# Patient Record
Sex: Female | Born: 1960 | Race: White | Hispanic: No | Marital: Married | Smoking: Never smoker
Health system: Southern US, Community
[De-identification: ages and names within clinical notes are randomized; demographics above are authoritative.]

## PROBLEM LIST (undated history)

## (undated) DIAGNOSIS — I10 Essential (primary) hypertension: Secondary | ICD-10-CM

## (undated) DIAGNOSIS — E119 Type 2 diabetes mellitus without complications: Secondary | ICD-10-CM

## (undated) DIAGNOSIS — M1990 Unspecified osteoarthritis, unspecified site: Secondary | ICD-10-CM

## (undated) DIAGNOSIS — F418 Other specified anxiety disorders: Secondary | ICD-10-CM

## (undated) DIAGNOSIS — M8580 Other specified disorders of bone density and structure, unspecified site: Secondary | ICD-10-CM

## (undated) DIAGNOSIS — N809 Endometriosis, unspecified: Secondary | ICD-10-CM

## (undated) DIAGNOSIS — N200 Calculus of kidney: Secondary | ICD-10-CM

## (undated) DIAGNOSIS — E7800 Pure hypercholesterolemia, unspecified: Secondary | ICD-10-CM

## (undated) DIAGNOSIS — E669 Obesity, unspecified: Secondary | ICD-10-CM

## (undated) HISTORY — PX: LITHOTRIPSY: SUR834

## (undated) HISTORY — DX: Type 2 diabetes mellitus without complications: E11.9

## (undated) HISTORY — PX: APPENDECTOMY: SHX54

## (undated) HISTORY — PX: TONSILLECTOMY: SUR1361

## (undated) HISTORY — PX: CHOLECYSTECTOMY: SHX55

## (undated) HISTORY — DX: Endometriosis, unspecified: N80.9

## (undated) HISTORY — DX: Calculus of kidney: N20.0

## (undated) HISTORY — PX: PELVIC LAPAROSCOPY: SHX162

---
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
## 2003-08-02 ENCOUNTER — Ambulatory Visit (HOSPITAL_COMMUNITY): Admission: RE | Admit: 2003-08-02 | Discharge: 2003-08-02 | Payer: Self-pay | Admitting: Otolaryngology

## 2003-08-02 ENCOUNTER — Ambulatory Visit (HOSPITAL_BASED_OUTPATIENT_CLINIC_OR_DEPARTMENT_OTHER): Admission: RE | Admit: 2003-08-02 | Discharge: 2003-08-02 | Payer: Self-pay | Admitting: Otolaryngology

## 2003-08-02 ENCOUNTER — Encounter (INDEPENDENT_AMBULATORY_CARE_PROVIDER_SITE_OTHER): Payer: Self-pay | Admitting: Specialist

## 2004-05-04 ENCOUNTER — Other Ambulatory Visit: Admission: RE | Admit: 2004-05-04 | Discharge: 2004-05-04 | Payer: Self-pay | Admitting: Addiction Medicine

## 2004-05-22 ENCOUNTER — Ambulatory Visit: Payer: Self-pay | Admitting: Gastroenterology

## 2004-06-11 ENCOUNTER — Ambulatory Visit: Payer: Self-pay | Admitting: Gastroenterology

## 2004-06-25 ENCOUNTER — Ambulatory Visit (HOSPITAL_BASED_OUTPATIENT_CLINIC_OR_DEPARTMENT_OTHER): Admission: RE | Admit: 2004-06-25 | Discharge: 2004-06-25 | Payer: Self-pay | Admitting: Obstetrics and Gynecology

## 2005-07-30 ENCOUNTER — Other Ambulatory Visit: Admission: RE | Admit: 2005-07-30 | Discharge: 2005-07-30 | Payer: Self-pay | Admitting: Obstetrics and Gynecology

## 2006-01-10 ENCOUNTER — Ambulatory Visit (HOSPITAL_COMMUNITY): Admission: RE | Admit: 2006-01-10 | Discharge: 2006-01-10 | Payer: Self-pay | Admitting: Obstetrics and Gynecology

## 2006-01-28 ENCOUNTER — Encounter: Admission: RE | Admit: 2006-01-28 | Discharge: 2006-01-28 | Payer: Self-pay | Admitting: Obstetrics and Gynecology

## 2007-08-07 ENCOUNTER — Other Ambulatory Visit: Admission: RE | Admit: 2007-08-07 | Discharge: 2007-08-07 | Payer: Self-pay | Admitting: Obstetrics and Gynecology

## 2008-04-30 IMAGING — MG MM DIGITAL SCREENING BILAT
6 series · 6 of 6 positions shown · non-contrast
Comparison: none

DG SCREEN MAMMOGRAM BILATERAL
Bilateral CC and MLO view(s) were taken.

DIGITAL SCREENING MAMMOGRAM WITH CAD:
There is a fibroglandular pattern.  A possible mass is noted in the left breast.  Spot compression 
views and possibly sonography are recommended for further evaluation.  In the right breast, no 
masses or malignant type calcifications are identified.

[R CC (1 of 2)]
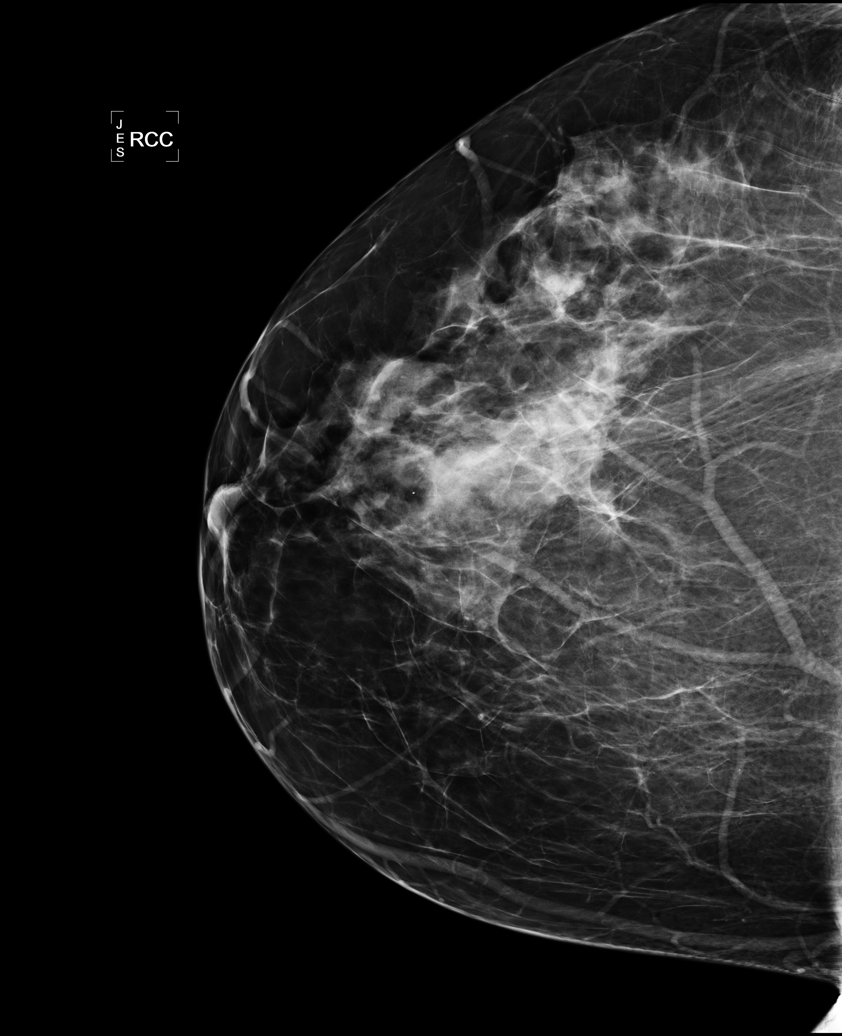

[R MLO]
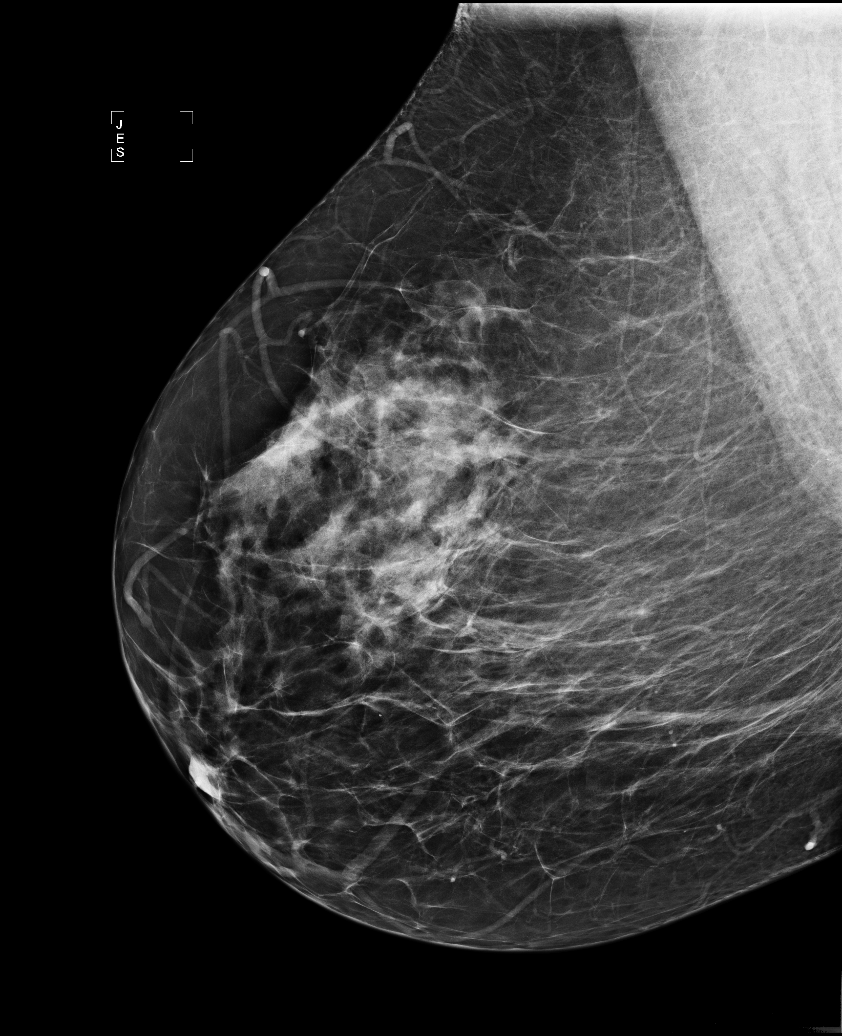

[L CC]
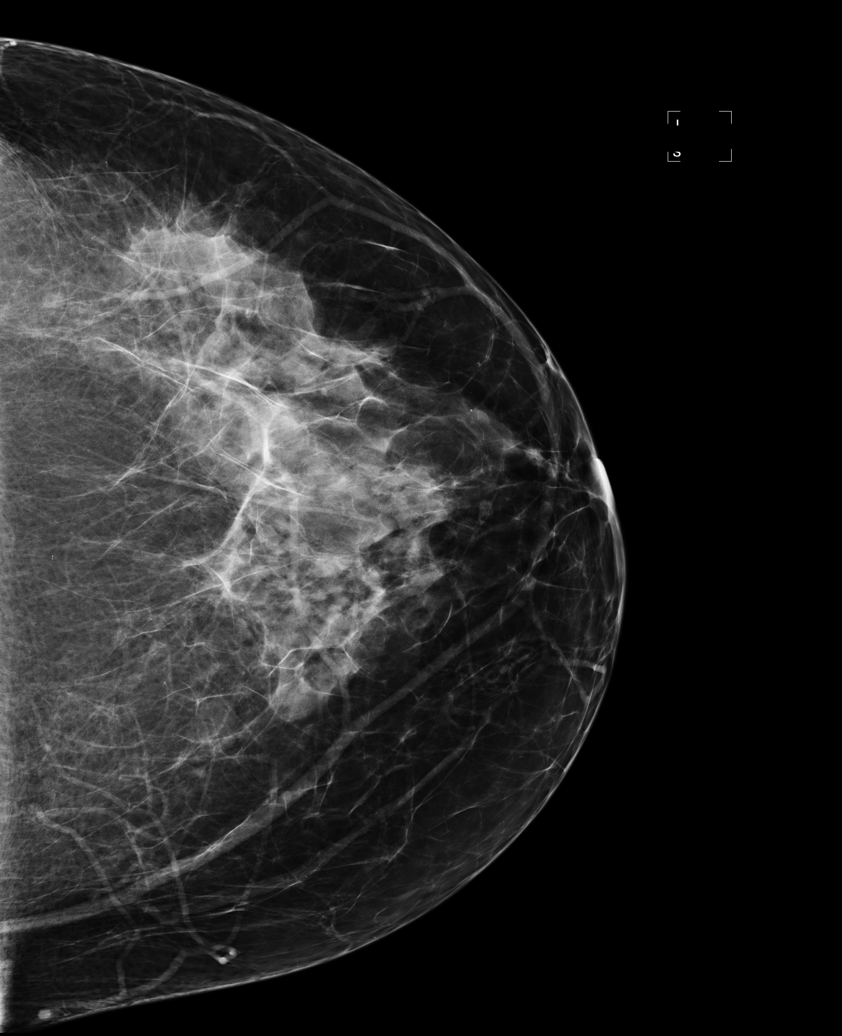

[L MLO (1 of 2)]
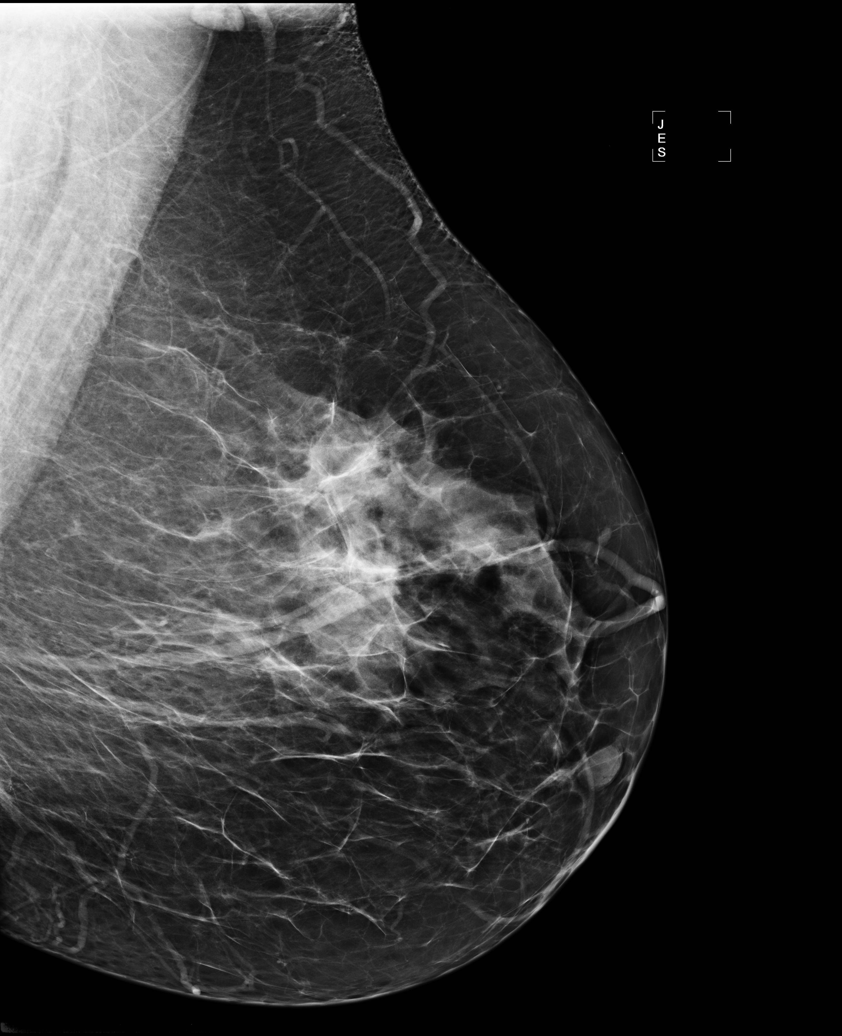

[R CC (2 of 2)]
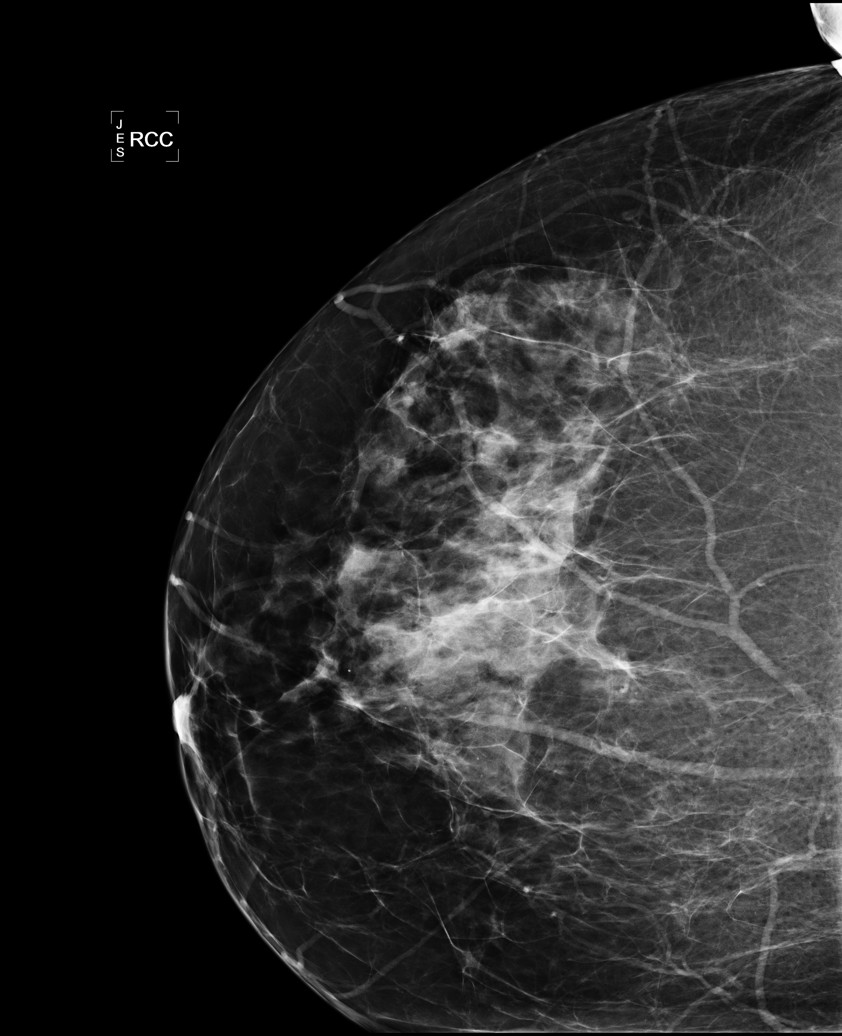

[L MLO (2 of 2)]
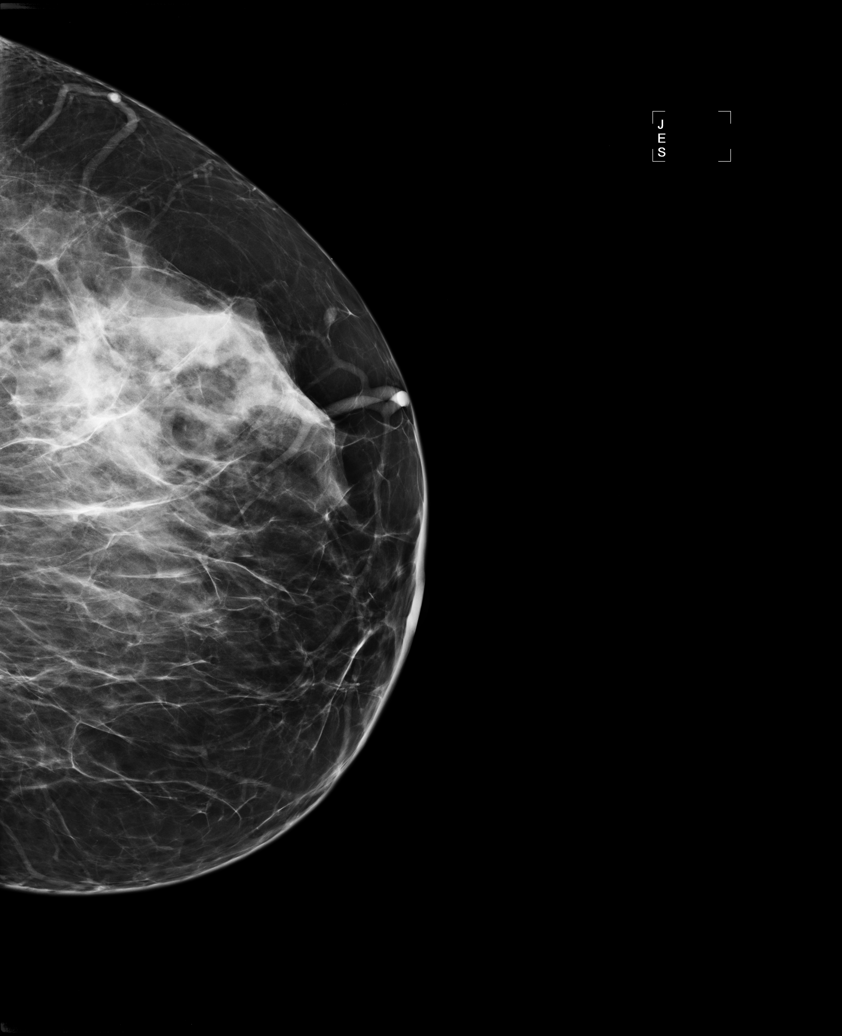

[6 of 6 positions shown; findings below may reference images not displayed]

IMPRESSION: Possible mass, left breast.  Additional evaluation is indicated.  The patient will be contacted for
additional studies and a supplementary report will follow.  No specific mammographic evidence of 
malignancy, right breast.

ASSESSMENT: Need additional imaging evaluation and/or prior mammograms for comparison - BI-RADS 0

Further imaging of the left breast.
ANALYZED BY COMPUTER AIDED DETECTION. , THIS PROCEDURE WAS A DIGITAL MAMMOGRAM.

## 2009-01-28 ENCOUNTER — Encounter: Admission: RE | Admit: 2009-01-28 | Discharge: 2009-01-28 | Payer: Self-pay | Admitting: Obstetrics and Gynecology

## 2009-04-30 ENCOUNTER — Other Ambulatory Visit: Admission: RE | Admit: 2009-04-30 | Discharge: 2009-04-30 | Payer: Self-pay | Admitting: Obstetrics and Gynecology

## 2009-04-30 ENCOUNTER — Ambulatory Visit: Payer: Self-pay | Admitting: Obstetrics and Gynecology

## 2009-05-14 ENCOUNTER — Ambulatory Visit: Payer: Self-pay | Admitting: Obstetrics and Gynecology

## 2009-09-15 ENCOUNTER — Ambulatory Visit (HOSPITAL_COMMUNITY): Admission: RE | Admit: 2009-09-15 | Discharge: 2009-09-15 | Payer: Self-pay | Admitting: Urology

## 2010-01-25 HISTORY — PX: CYSTOSCOPY W/ URETEROSCOPY: SUR379

## 2010-02-03 ENCOUNTER — Encounter
Admission: RE | Admit: 2010-02-03 | Discharge: 2010-02-03 | Payer: Self-pay | Source: Home / Self Care | Attending: Obstetrics and Gynecology | Admitting: Obstetrics and Gynecology

## 2010-02-15 ENCOUNTER — Encounter: Payer: Self-pay | Admitting: Obstetrics and Gynecology

## 2010-05-19 ENCOUNTER — Other Ambulatory Visit (HOSPITAL_COMMUNITY)
Admission: RE | Admit: 2010-05-19 | Discharge: 2010-05-19 | Disposition: A | Discharge status: Home / Self Care | Payer: PRIVATE HEALTH INSURANCE | Source: Ambulatory Visit | Attending: Obstetrics and Gynecology | Admitting: Obstetrics and Gynecology

## 2010-05-19 ENCOUNTER — Encounter (INDEPENDENT_AMBULATORY_CARE_PROVIDER_SITE_OTHER): Payer: PRIVATE HEALTH INSURANCE | Admitting: Obstetrics and Gynecology

## 2010-05-19 ENCOUNTER — Other Ambulatory Visit: Payer: Self-pay | Admitting: Obstetrics and Gynecology

## 2010-05-19 DIAGNOSIS — Z01419 Encounter for gynecological examination (general) (routine) without abnormal findings: Secondary | ICD-10-CM

## 2010-05-19 DIAGNOSIS — Z124 Encounter for screening for malignant neoplasm of cervix: Secondary | ICD-10-CM | POA: Insufficient documentation

## 2010-05-20 ENCOUNTER — Ambulatory Visit (HOSPITAL_BASED_OUTPATIENT_CLINIC_OR_DEPARTMENT_OTHER)
Admission: RE | Admit: 2010-05-20 | Discharge: 2010-05-20 | Disposition: A | Discharge status: Home / Self Care | Payer: PRIVATE HEALTH INSURANCE | Source: Ambulatory Visit | Attending: Urology | Admitting: Urology

## 2010-05-20 DIAGNOSIS — I10 Essential (primary) hypertension: Secondary | ICD-10-CM | POA: Insufficient documentation

## 2010-05-20 DIAGNOSIS — Z01810 Encounter for preprocedural cardiovascular examination: Secondary | ICD-10-CM | POA: Insufficient documentation

## 2010-05-20 DIAGNOSIS — N201 Calculus of ureter: Secondary | ICD-10-CM | POA: Insufficient documentation

## 2010-05-20 DIAGNOSIS — Z01812 Encounter for preprocedural laboratory examination: Secondary | ICD-10-CM | POA: Insufficient documentation

## 2010-05-20 LAB — POCT I-STAT 4, (NA,K, GLUC, HGB,HCT)
Glucose, Bld: 112 mg/dL — ABNORMAL HIGH (ref 70–99)
Potassium: 4.3 mEq/L (ref 3.5–5.1)

## 2010-05-25 NOTE — Op Note (Signed)
  NAMEJEANEAN, HOLLETT                ACCOUNT NO.:  [id number]  MEDICAL RECORD NO.:  [id number]          PATIENT TYPE:  LOCATION:                                 FACILITY:  PHYSICIAN:  Valetta Fuller, M.D.  DATE OF BIRTH:  July 19, 1960  DATE OF PROCEDURE: DATE OF DISCHARGE:                              OPERATIVE REPORT   PREOPERATIVE DIAGNOSIS:  A 5-mm distal left ureteral stone.  POSTOPERATIVE DIAGNOSIS:  A 5-mm distal left ureteral stone.  PROCEDURES PERFORMED: 1. Cystoscopy. 2. Left retrograde pyelogram with fluoroscopic interpretation. 3. Left ureteroscopy with stone basketing.  SURGEON:  Valetta Fuller, M.D.  ANESTHESIA:  General.  INDICATIONS:  Ms. Bottcher has had multiple episodes of nephrolithiasis. The patient recently presented with left flank pain and has had approximately 1 month of ongoing mild discomfort.  Imaging revealed a 5- mm stone just above her left ureterovesical junction.  She has been unable to pass the stone, and last time she was in our office she requested intervention.  Given the size and location of the stone as well as its poor visualization on scout imaging, we fell ureteroscopy would be a better option.  The advantages, disadvantages were discussed with her as well as risks and full informed consent obtained.  TECHNIQUE AND FINDINGS:  The patient was brought to the operating room. She had successful induction of general anesthesia and was placed in mid- lithotomy position and prepped and draped in the usual manner.  She received perioperative ciprofloxacin and appropriate surgical time-out was performed.  Cystoscopy was unremarkable.  Retrograde pyelogram was done with an open-ended catheter and fluoroscopic interpretation.  A 5-mm filling defect noted about a centimeter above her ureterovesical junction with mild obstruction and mild dilation of the proximal collecting system.  Guidewire was placed beyond the stone to the left renal  pelvis.  The distal left ureter was then easily engaged with a 6.5 [place] rigid ureteroscope.  No need for dilation occurred.  The stone was encountered which was basket extracted without difficulty.  There did not appear to be a significant inflammatory response.  Given the lack of need for dilation and a fairly atraumatic stone removal, we felt the patient could be done without double-J stent placement for her benefit.  The guidewire was removed.  Lidocaine jelly was instilled per urethra and the patient was brought to recovery room in stable condition, having had no obvious complications or problems.     Valetta Fuller, M.D.     DSG/MEDQ  D:  05/20/2010  T:  05/20/2010  Job:  [id number]  Electronically Signed by Barron Alvine M.D. on 05/25/2010 12:10:36 PM

## 2010-05-26 ENCOUNTER — Encounter (INDEPENDENT_AMBULATORY_CARE_PROVIDER_SITE_OTHER): Payer: PRIVATE HEALTH INSURANCE

## 2010-05-26 DIAGNOSIS — M949 Disorder of cartilage, unspecified: Secondary | ICD-10-CM

## 2010-06-01 ENCOUNTER — Other Ambulatory Visit (INDEPENDENT_AMBULATORY_CARE_PROVIDER_SITE_OTHER): Payer: PRIVATE HEALTH INSURANCE

## 2010-06-01 DIAGNOSIS — M2550 Pain in unspecified joint: Secondary | ICD-10-CM

## 2010-06-01 DIAGNOSIS — M899 Disorder of bone, unspecified: Secondary | ICD-10-CM

## 2010-06-12 NOTE — Op Note (Signed)
NAMESHENAE, BONANNO                            ACCOUNT NO.:  [id number]   MEDICAL RECORD NO.:  [id number]                   PATIENT TYPE:  AMB   LOCATION:  [facility]                                  FACILITY:  [facility]   PHYSICIAN:  Lucky Cowboy, M.D.                    DATE OF BIRTH:  [birth year]   DATE OF PROCEDURE:  08/02/2003  DATE OF DISCHARGE:                                 OPERATIVE REPORT   CONTINUATION:  Hemostasis was achieved using suction cautery in two  locations on each side.  An NG tube was placed down the esophagus for  suctioning of the gastric contents.  The patient's mouth gag was then  removed, noting no damage to the teeth or the soft tissues.  The table was  rotated clockwise 90 degrees, to its original position.  The patient was awakened from anesthesia and taken to the post-anesthesia  care unit in stable condition.  There were no complications.                                               Lucky Cowboy, M.D.    SJ/MEDQ  D:  08/02/2003  T:  08/03/2003  Job:  [id number]

## 2010-06-12 NOTE — Op Note (Signed)
NAME:  Heidi Hogan, Heidi Hogan                ACCOUNT NO.:  [id number]   MEDICAL RECORD NO.:  [id number]          PATIENT TYPE:  AMB   LOCATION:  [facility]                         FACILITY:  [facility]   PHYSICIAN:  Daniel L. Gottsegen, M.D.DATE OF BIRTH:  July 27, 1960   DATE OF PROCEDURE:  06/25/2004  DATE OF DISCHARGE:                                 OPERATIVE REPORT   PREOPERATIVE DIAGNOSIS:  Left lower quadrant pain.   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   OPERATIONS:  Diagnostic laparoscopy with lysis of pelvic adhesions and laser  vaporization of endometriosis.   SURGEON:  Daniel L. Eda Paschal, M.D.   ANESTHESIA:  General endotracheal.   INDICATIONS:  The patient is a 50 year old female with a long history of  left lower quadrant pain. Ultrasound  in the office was normal except for a  possibly adherent left ovary. The GI and GU workups were obtained prior to  surgery, and neither could reveal a source of her pain. As a result of this,  she now enters the hospital for laparoscopy and appropriate treatment. She  has even given me permission to remove the left ovary and tube if its  adherent and is the source of her pain.   FINDINGS:  At the time of surgery, it appeared that the greatest source of  her pain was an omental adhesion. It was fairly broad-based, and it extended  down on the left side. You could see where it was pulling on her intestine,  and it was adherent into the site of her previous cesarean section. Once  this was freed up, the patient had a variety of areas of endometriosis. Both  ovaries and tubes were free of disease as was the vesicouterine fold, the  peritoneum, but the broad ligament posteriorly had significant endometriosis  present.  There were a least four or five different areas of implants. They  appeared to be more superficial than deep.   PROCEDURE:  After adequate general orotracheal anesthesia, the  patient was  placed in the dorsal supine position, prepped and draped in the usual  sterile manner. The bladder was emptied with a Foley catheter. A Hulka  catheter was inserted into the uterus. A pneumoperitoneum was created by  making a subumbilical incision and placing a Verhey needle into the  peritoneal cavity. A pneumoperitoneum was created without difficulty. The 10-  mm trocar was then placed and through that, the operating laparoscope was  placed. A port was placed suprapubically in the midline. First, the  adhesions were taken down using then a Neodidium YAG laser at 12 watts  power. This was done successfully so that we could now see the left side of  the pelvis which we could not when we started the procedure. Care was taken  throughout to be sure that there was no bowel stuck behind the omentum.  There were several areas that bled, and bipolar coagulation stopped the  bleeding without difficulty. Once adhesions had been taken down, all the  endometriosis described above could be seen. It was treated with an  Neodidium YAG laser at 12 watts power without difficulty. It was difficult  to see her ureters, but the areas of endometriosis were elevated away from  the retroperitoneal structures, and it appeared that we had seen her ureters  and that these were far from this site. At the termination of the procedure,  there was absolutely no bleeding noted. All trocars were removed. The  pneumoperitoneum was evacuated. The subumbilical fascial incision was closed  with 0 Vicryl, and then the skin incisions were closed with Dermabond. The  patient tolerated the procedure well and left the operating room in  satisfactory condition.      DLG/MEDQ  D:  06/25/2004  T:  06/25/2004  Job:  [id number]

## 2010-06-12 NOTE — Op Note (Signed)
[name], Heidi Hogan                            ACCOUNT NO.:  [id number]   MEDICAL RECORD NO.:  [id number]                   PATIENT TYPE:  AMB   LOCATION:  [facility]                                  FACILITY:  [facility]   PHYSICIAN:  Lucky Cowboy, M.D.                    DATE OF BIRTH:  01-20-1961   DATE OF PROCEDURE:  08/02/2003  DATE OF DISCHARGE:                                 OPERATIVE REPORT   INCOMPLETE:   PREOPERATIVE DIAGNOSIS:  Chronic tonsillitis.   POSTOPERATIVE DIAGNOSIS:  Chronic tonsillitis.   PROCEDURE:  Tonsillectomy.   SURGEON:  Lucky Cowboy, M.D.   ANESTHESIA:  General.   ESTIMATED BLOOD LOSS:  Was less than 20 mL.   COMPLICATIONS:  None.   SPECIMENS:  Tonsils.   INDICATIONS FOR PROCEDURE:  The patient is a 50 year old female who has  experienced multiple episodes of tonsillitis with frequent sore throats.  Due to the number of infections, a tonsillectomy is performed.   FINDINGS:  The patient was noted to have 2+ bilateral palatine tonsils.  These were cryptic but without signs of an acute infection.   DESCRIPTION OF PROCEDURE:  The patient was taken to the operating room and  placed on the table in the supine position.  She was then placed under  general endotracheal anesthesia, and the table rotated counter-clockwise 90  degrees.  The neck was gently extended using a shoulder roll.  A Crowe-Davis  mouth gag with a #3 tongue blade was then placed intraorally, opened and  suspended on the Mayo stand.  The right palatine tonsil was grasped with  Allis clamps and directed inferomedially.  The Harmonic scalpel was then  used to excise the tonsil, staying within the peritonsillar space, adjacent  to the tonsillar capsule.  The left palatine tonsil was removed in an  identical fashion.  Suction cautery was required in two locations for  hemostasis.                                               Lucky Cowboy, M.D.    SJ/MEDQ  D:  08/02/2003  T:  08/03/2003  Job:   [id number]

## 2010-06-25 ENCOUNTER — Other Ambulatory Visit: Payer: Self-pay | Admitting: Family Medicine

## 2010-06-25 DIAGNOSIS — M25562 Pain in left knee: Secondary | ICD-10-CM

## 2010-06-26 ENCOUNTER — Ambulatory Visit
Admission: RE | Admit: 2010-06-26 | Discharge: 2010-06-26 | Disposition: A | Discharge status: Home / Self Care | Payer: PRIVATE HEALTH INSURANCE | Source: Ambulatory Visit | Attending: Family Medicine | Admitting: Family Medicine

## 2010-06-26 DIAGNOSIS — M25562 Pain in left knee: Secondary | ICD-10-CM

## 2010-07-28 ENCOUNTER — Observation Stay (HOSPITAL_COMMUNITY)
Admission: EM | Admit: 2010-07-28 | Discharge: 2010-07-30 | DRG: 287 | Disposition: A | Discharge status: Home / Self Care | Payer: PRIVATE HEALTH INSURANCE | Attending: Internal Medicine | Admitting: Internal Medicine

## 2010-07-28 ENCOUNTER — Emergency Department (HOSPITAL_COMMUNITY): Payer: PRIVATE HEALTH INSURANCE

## 2010-07-28 DIAGNOSIS — I2510 Atherosclerotic heart disease of native coronary artery without angina pectoris: Secondary | ICD-10-CM | POA: Insufficient documentation

## 2010-07-28 DIAGNOSIS — E785 Hyperlipidemia, unspecified: Secondary | ICD-10-CM | POA: Insufficient documentation

## 2010-07-28 DIAGNOSIS — F329 Major depressive disorder, single episode, unspecified: Secondary | ICD-10-CM | POA: Insufficient documentation

## 2010-07-28 DIAGNOSIS — E669 Obesity, unspecified: Secondary | ICD-10-CM | POA: Insufficient documentation

## 2010-07-28 DIAGNOSIS — S01119A Laceration without foreign body of unspecified eyelid and periocular area, initial encounter: Secondary | ICD-10-CM | POA: Insufficient documentation

## 2010-07-28 DIAGNOSIS — R61 Generalized hyperhidrosis: Secondary | ICD-10-CM | POA: Insufficient documentation

## 2010-07-28 DIAGNOSIS — I10 Essential (primary) hypertension: Secondary | ICD-10-CM | POA: Insufficient documentation

## 2010-07-28 DIAGNOSIS — F3289 Other specified depressive episodes: Secondary | ICD-10-CM | POA: Insufficient documentation

## 2010-07-28 DIAGNOSIS — R0789 Other chest pain: Principal | ICD-10-CM | POA: Insufficient documentation

## 2010-07-28 LAB — LIPID PANEL
HDL: 41 mg/dL (ref >39)
Total CHOL/HDL Ratio: 4.6 RATIO
Triglycerides: 178 mg/dL — ABNORMAL HIGH (ref <150)

## 2010-07-28 LAB — CBC
HCT: 37.8 % (ref 36.0–46.0)
MCHC: 34.1 g/dL (ref 30.0–36.0)
MCV: 88.3 fL (ref 78.0–100.0)

## 2010-07-28 LAB — HEMOGLOBIN A1C
Hgb A1c MFr Bld: 6.4 % — ABNORMAL HIGH (ref <5.7)
Mean Plasma Glucose: 137 mg/dL — ABNORMAL HIGH (ref <117)

## 2010-07-28 LAB — CARDIAC PANEL(CRET KIN+CKTOT+MB+TROPI)
CK, MB: 1.3 ng/mL (ref 0.3–4.0)
Total CK: 62 U/L (ref 7–177)
Troponin I: <0.3 ng/mL (ref <0.30)

## 2010-07-28 LAB — COMPREHENSIVE METABOLIC PANEL
Albumin: 3.7 g/dL (ref 3.5–5.2)
Creatinine, Ser: 0.67 mg/dL (ref 0.50–1.10)
GFR calc Af Amer: >60 mL/min (ref >60)

## 2010-07-28 LAB — CK TOTAL AND CKMB (NOT AT ARMC)
CK, MB: 1.2 ng/mL (ref 0.3–4.0)
Relative Index: INVALID (ref 0.0–2.5)

## 2010-07-28 LAB — DIFFERENTIAL
Basophils Absolute: 0 10*3/uL (ref 0.0–0.1)
Eosinophils Absolute: 0.2 10*3/uL (ref 0.0–0.7)
Eosinophils Relative: 2 % (ref 0–5)
Lymphocytes Relative: 23 % (ref 12–46)
Monocytes Absolute: 0.6 10*3/uL (ref 0.1–1.0)
Monocytes Relative: 8 % (ref 3–12)

## 2010-07-28 LAB — LIPASE, BLOOD: Lipase: 28 U/L (ref 11–59)

## 2010-07-29 DIAGNOSIS — R079 Chest pain, unspecified: Secondary | ICD-10-CM

## 2010-07-29 DIAGNOSIS — R072 Precordial pain: Secondary | ICD-10-CM

## 2010-07-29 LAB — T4: T4, Total: 8.6 ug/dL (ref 5.0–12.5)

## 2010-07-29 LAB — CARDIAC PANEL(CRET KIN+CKTOT+MB+TROPI): Relative Index: INVALID (ref 0.0–2.5)

## 2010-07-29 LAB — T3: T3, Total: 80.6 ng/dl (ref 80.0–204.0)

## 2010-07-29 NOTE — H&P (Signed)
Heidi Hogan, [name]:  [id number]  MEDICAL RECORD NO.:  [id number]  LOCATION:  [facility]                         FACILITY:  [facility]  PHYSICIAN:  Talmage Nap, MD  DATE OF BIRTH:  [birth year]  DATE OF ADMISSION:  07/28/2010 DATE OF DISCHARGE:                             HISTORY & PHYSICAL   PRIMARY CARE PHYSICIAN:  Unassigned.  History obtainable from the patient and the patient's spouse.  CHIEF COMPLAINT:  Chest pain of about 2 days duration.  HISTORY OF PRESENT ILLNESS:  The patient is a 50 year old very pleasant Caucasian female with history of hypertension, hyperlipidemia, and obesity presenting to the emergency room with chest pain which started at rest. Pain was said to be retrosternal, described as achy about 7/10 in intensity and radiating to the right shoulder.  This was said to be associated with mild shortness of breath and nausea.  She  denied any history of vomiting.  She denied any fever.  She denied any chills. She denied any rigor.  Pain was said to have lasted about 30 minutes and subsequently subsided.  The first episode of pain was about 2 days ago and at bedtime.  It was said to be about 10/10 in intensity and was nonexertional.  There is no known relieving or aggravating factor.  The patient had a second episode, hence she decided to come to the emergency room to be evaluated.  PAST MEDICAL HISTORY: 1. Hypertension. 2. Arthritis. 3. Depression. 4. Hyperlipidemia. 5. Obesity. 6. kidney stones.  PAST SURGICAL HISTORY: 1. Appendectomy. 2. Cesarean section. 3. Tonsillectomy. 4. Cholecystectomy. 5. Kidney stone status post lithotripsy and also status post     urethroscopy with removal of stone.  PREADMISSION MEDICATIONS: 1. Multivitamin therapeutic one p.o. on Wednesday and Saturday. 2. Lipitor (atorvastatin) 10 mg one p.o. at bedtime. 3. Lisinopril 20 mg 1 p.o. at bedtime. 4. Fish oil over-the-counter 2 tablets p.o. at  bedtime. 5. Effexor SR (venlafaxine) 150 mg one p.o. at bedtime. 6. Relafen (nabumetone) 750 mg one p.o. b.i.d.  ALLERGIES:  She has no known allergies.  SOCIAL HISTORY:  Negative for alcohol or tobacco use.  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  FAMILY HISTORY:  Not significant for any coronary artery disease.  REVIEW OF SYSTEMS:  The patient denies any history of headaches.  No blurry vision.  No nausea or vomiting.  No fever.  No chills.  No rigor. No chest pain.  No shortness of breath.  Denies any cough.  No PND or orthopnea.  No palpitations.  No abdominal discomfort.  No diarrhea or hematochezia.  No dysuria or hematuria.  No swelling of the lower extremities.  No intolerance to heat or cold and no neuropsychiatric disorder.  PHYSICAL EXAMINATION:  GENERAL:  Middle-aged lady,obese,well-hydrated, not in any  respiratory distress at present. VITAL SIGNS:  Blood pressure is 143/67, pulse is 75, respiratory rate 18, temperature is 98.7. HEENT:  Pupils are reactive to light and extraocular muscles are intact. NECK:  No jugular venous distention.  No carotid bruit.  No lymphadenopathy. CHEST:  Clear to auscultation. HEART:  Sounds are 1 and 2. ABDOMEN:  Obese, nontender.  Liver, spleen,  and kidney not palpable. Bowel sounds are positive. EXTREMITIES:  No pedal edema. NEUROLOGIC:  Nonfocal. MUSCULOSKELETAL:  Unremarkable. SKIN:  Normal turgor.  LABORATORY DATA:  First set of cardiac marker, troponin-I less than 0.30.  Hematological indices showed WBC of 7.9, hemoglobin 12.9, hematocrit 37.8, MCV 88.3 with a platelet count of 357 with normal differential.  Chemistry shows sodium of 138, potassium of 4.0, chloride of 102 with a bicarb of 26, glucose is 107, BUN is 14, creatinine 0.67. LFTs showed total protein 7.3, albumin is 3.7, AST 32, ALT 42, alkaline phosphatase 52.  EKG showed normal sinus rhythm with a rate of 72 beats per minute.  No acute ST-wave  change noted.  Otherwise normal EKG. Chest x-ray was normal.  IMPRESSION: 1. Chest pain to rule out acute coronary syndrome. 2. Hypertension. 3. Hyperlipidemia. 4. Arthritis. 5. Depression. 6. History of kidney stone.  PLAN:  Admit the patient to telemetry.  The patient will be on aspirin 325 mg p.o. daily, nitro 0.4 mg sublingual p.r.n. for chest pain, morphine 2 mg IV q.4 p.r.n., lisinopril 20 mg p.o. daily, Lipitor 10 mg daily, fish oil over-the-counter 2 tablets p.o. daily.  The patient will be on Effexor SR (venlafaxine) 150 mg p.o. daily.  GI prophylaxis will be done with Protonix 40 mg p.o. daily and DVT prophylaxis with Lovenox 40 mg subcu q.24.  Further workup to be done on this patient will include cardiac enzymes q.6 x3.  Thyroid panel which include TSH, T3, and T4.  Lipid panel, hemoglobin A1c, and D-dimer stat.  Imaging study to be ordered will include 2-D echo.Ultimately, the patient will need Cardiolite stress test either as an inpatient or outpatient.She will be followed and evaluated on a day-to-day basis.     Talmage Nap, MD     CN/MEDQ  D:  07/28/2010  T:  07/28/2010  Job:  [id number]  Electronically Signed by Talmage Nap  on 07/29/2010 06:47:03 PM

## 2010-07-30 LAB — PROTIME-INR: INR: 1.01 (ref 0.00–1.49)

## 2010-07-31 NOTE — Cardiovascular Report (Signed)
  NAMEJOSALYNN, JOHNDROW                ACCOUNT NO.:  [id number]  MEDICAL RECORD NO.:  [id number]  LOCATION:  3701                         FACILITY:  [facility]  PHYSICIAN:  Rollene Rotunda, MD, FACCDATE OF BIRTH:  Oct 05, 1960  DATE OF PROCEDURE: DATE OF DISCHARGE:  07/30/2010                           CARDIAC CATHETERIZATION   PROCEDURE:  Left heart catheterization/coronary arteriography.  INDICATIONS:  The patient with chest pain suggestive of unstable angina.  PROCEDURE NOTE:  Left heart catheterization was performed via the right radial artery, the radial was cannulated via an anterior wall puncture. A #5-French arterial sheath was inserted via the modified Seldinger technique.  Preformed Judkins and a pigtail catheter were utilized.  The patient tolerated the procedure well, left the lab in stable condition.  RESULTS:  Hemodynamics LV 121/8, AO 119/75.  Coronaries of left main was normal.  The LAD had mid long 25% stenosis.  First and second diagonal were small and normal.  Circumflex in the AV groove was normal.  There was a mid obtuse marginal which was large branching and normal. Posterolateral was tiny and normal.  The right coronary artery was a large dominant vessel.  It was normal throughout its course.  PDA was small and normal.  Posterolateral was small and normal.  Left ventriculogram:  The left ventriculogram was obtained in the RAO projection.  The EF was 65% with normal wall motion.  CONCLUSION:  Minimal coronary plaque.  Normal left ventricular function.  PLAN:  The patient will have medical management.  Further evaluation of nonanginal chest discomfort will be per her primary physician.     Rollene Rotunda, MD, [facility]     JH/MEDQ  D:  07/30/2010  T:  07/30/2010  Job:  [id number]  cc:   Burnell Blanks, MD  Electronically Signed by Rollene Rotunda MD [facility] on 07/31/2010 05:38:15 PM

## 2010-08-09 NOTE — Discharge Summary (Signed)
Heidi Hogan, STRACENER                ACCOUNT NO.:  [id number]  MEDICAL RECORD NO.:  [id number]  LOCATION:  3701                         FACILITY:  [facility]  PHYSICIAN:  Jeoffrey Massed, MD    DATE OF BIRTH:  [birth year]  DATE OF ADMISSION:  07/28/2010 DATE OF DISCHARGE:                        DISCHARGE SUMMARY - REFERRING   PRIMARY CARE PRACTITIONER:  Dr. Burnell Blanks at [place].  PRIMARY DISCHARGE DIAGNOSIS:  Atypical chest pain.  SECONDARY DISCHARGE DIAGNOSES: 1. Hypertension. 2. Dyslipidemia. 3. Obesity. 4. History of nephrolithiasis. 5. History of arthritis. 6. History of depression.  DISCHARGE MEDICATIONS: 1. Aspirin 81 mg 1 tablet daily. 2. Protonix 40 mg 1 tablet daily. 3. Effexor XR 150 mg 1 tablet every evening. 4. Fish oil 2 capsules p.o. every evening. 5. Lipitor 10 mg 1 tablet every evening. 6. Lisinopril 20 mg 1 tablet every evening. 7. Multivitamins 1 tablet p.o., Wednesdays and Saturdays. 8. Nabumetone 750 mg 1 tablet p.o. twice daily p.r.n.  CONSULTATION:  Dr. Elease Hashimoto from [facility].  BRIEF HISTORY OF PRESENT ILLNESS:  The patient is a very pleasant 50- year-old female with a history of hypertension, dyslipidemia, obesity, presented with chest pain.  Per her, the chest pain was associated with some nausea, shortness of breath and mild diaphoresis.  She was then admitted to the hospitalist service for further evaluation and treatment.  For further details, please see the history and physical that was dictated by Dr. Beverly Gust on admission.  PROCEDURES PERFORMED:  The patient underwent a left heart cardiac catheterization which showed minimal coronary plaque with normal LV. Cardiology has recommended further medical management.  PERTINENT LABORATORY DATA: 1. LDL cholesterol was at 111. 2. D-dimer was 0.39 which is within normal range. 3. BUN 6.4. 4. TSH was 0.976. 5. Cardiac enzymes were cycled and these were negative.  PERTINENT RADIOLOGICAL  STUDIES:  Chest x-ray done on July 28, 2010, showed no acute cardiopulmonary disease.  BRIEF HOSPITAL COURSE: 1. Chest pain.  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  She was felt to be a candidate for a     cardiac cath instead of stress test.  She subsequently underwent a     cardiac cath today which showed minimal coronary artery disease and     will continue to require ongoing medical management.  She will be     discharged home later today. 2. Hypertension.  This is stable.  She is to continue Lipitor. 3. Dyslipidemia.  She is to continue Lipitor and fish oil.  She is to     follow up with her primary care practitioner for further continued     care. 4. Arthritis.  This is stable. 5. Depression.  This is stable and she is to continue her Effexor.  DISPOSITION:  It is felt that the patient has met maximal benefit from her inpatient hospital stay, her cardiac catheterization is negative for any significant coronary artery disease and is stable to be discharged home later today.  FOLLOWUP INSTRUCTIONS: 1. The patient will follow up with her primary care practitioner, Dr.     Sharman Crate  Hamrick, within 1-2 weeks upon discharge.  She is to call and     make an appointment. 2. The patient has been counseled extensively by me in regard to the     importance of aggressive risk factor modification for coronary     artery disease, all of this has been explained to her personally by     myself and she claims understanding.  It is also recommended that     she be on a diet and exercise regimen to lose further weight.  She     claims understanding as well.  Total time spent for discharge 45 minutes.     Jeoffrey Massed, MD     SG/MEDQ  D:  07/30/2010  T:  07/30/2010  Job:  [id number]  cc:   Burnell Blanks, MD  Electronically Signed by Jeoffrey Massed  on 08/09/2010 10:56:12 AM

## 2010-08-13 NOTE — Consult Note (Signed)
NAMEDEBRAANN, LIVINGSTONE NO.:  [id number]  MEDICAL RECORD NO.:  [id number]  LOCATION:  3701                         FACILITY:  [facility]  PHYSICIAN:  Vesta Mixer, M.D. DATE OF BIRTH:  11-23-1960  DATE OF CONSULTATION:  07/29/2010 DATE OF DISCHARGE:                                CONSULTATION   Heidi Hogan is a 50 year old female with a history of obesity, hypertension, hyperlipidemia.  She was admitted to the hospital yesterday with episodes of chest pain.  The patient has been in fairly good health.  She has recently been started on cholesterol medicine and blood pressure medicine.  She has had a long history of obesity and uncontrolled hyperlipidemia as well as hypertension.  Several days ago, she had an episode of chest pain.  It was described as a chest pressure or tightness.  It was associated with nausea.  It radiated up to her interscapular region and to her right scapula.  It was associated with some mild diaphoresis.  She also had some shortness of breath.  The episode started with rest and lasted about 30-35 minutes.  It resolved spontaneously.  There was no pleuritic component to the chest pain.  The patient does not get any regular exercise.  She has not had any worsening of her pain, getting up and going to the bathroom.  She denies any syncope or presyncope.  She denies any orthopnea although she feels better if she popped herself upon the pillow.  CURRENT MEDICATIONS: 1. Aspirin 325 mg a day. 2. Lipitor 10 mg a day. 3. Lovaza 2 g a day. 4. Effexor 150 mg a day. 5. Protonix 40 mg a day. 6. Lisinopril 20 mg at night. 7. Nitroglycerin as needed.  ALLERGIES:  She has no known drug allergies.  PAST MEDICAL HISTORY: 1. Obesity. 2. Hypertension. 3. Hyperlipidemia.  SOCIAL HISTORY:  The patient does not smoke.  She drinks alcohol very rarely.  FAMILY HISTORY:  Negative for any significant coronary artery disease.  REVIEW OF SYSTEMS:  All  14-point review of systems were reviewed and were negative.  PHYSICAL EXAMINATION:  GENERAL:  She is a middle-aged female, in no acute distress.  She is alert and oriented x3.  Mood and affect are normal. HEENT:  Sclerae are nonicteric.  Her mucous membranes are moist. NECK:  2+ carotids.  She has no bruits.  There is no JVD.  Her neck is supple. LUNGS:  Clear. HEART:  Regular rate.  S1 and S2.  She has no murmurs, gallops, or rubs. BREASTS:  She has fairly large breasts. ABDOMEN:  Obese.  There is no hepatosplenomegaly.  There is no guarding or rebound. EXTREMITIES:  She has no clubbing, cyanosis, or edema.  Her pulses are intact. NEUROLOGIC:  Nonfocal.  Her EKG is currently pending.  I could not locate the EKG from the emergency room.  Her cardiac enzymes are negative.  Her sodium is 138, potassium is 4.0, chloride is 102, CO2 is 26, glucose is 107, creatinine is 0.67.  Her white blood cell count is 7.9, hemoglobin is 12.9, hematocrit is 37.8.  Her echocardiogram was performed this morning and will be read shortly.  IMPRESSION  AND PLAN: 1. Chest pain.  The patient presents with episodes of chest pain.  She     has several risk factors including hypertension and hyperlipidemia     as well as obesity.  She suspect that she has had     hypercholesterolemia.  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     Her breasts are fairly large and I do not think that a stress     Myoview study would be of any benefit.  I think that there could be     significant breast attenuation from the Myoview study.  Given her     symptoms, I think best option would be to proceed with cardiac     catheterization.  I have discussed the risks, benefits, and options     of heart catheterization.  She understands and agrees to proceed.     All of her other medical problems remained stable.     Vesta Mixer, M.D.     PJN/MEDQ  D:  07/29/2010  T:  07/29/2010  Job:  [id number]  cc:   Burnell Blanks, MD  Electronically Signed by Kristeen Miss M.D. on 08/13/2010 02:45:49 PM

## 2011-02-08 ENCOUNTER — Other Ambulatory Visit: Payer: Self-pay | Admitting: Obstetrics and Gynecology

## 2011-02-08 DIAGNOSIS — Z1231 Encounter for screening mammogram for malignant neoplasm of breast: Secondary | ICD-10-CM

## 2011-02-12 ENCOUNTER — Ambulatory Visit: Payer: PRIVATE HEALTH INSURANCE

## 2011-02-16 ENCOUNTER — Ambulatory Visit: Payer: PRIVATE HEALTH INSURANCE

## 2011-03-15 ENCOUNTER — Ambulatory Visit
Admission: RE | Admit: 2011-03-15 | Discharge: 2011-03-15 | Disposition: A | Discharge status: Home / Self Care | Payer: BC Managed Care – PPO | Source: Ambulatory Visit | Attending: Obstetrics and Gynecology | Admitting: Obstetrics and Gynecology

## 2011-03-15 DIAGNOSIS — Z1231 Encounter for screening mammogram for malignant neoplasm of breast: Secondary | ICD-10-CM

## 2011-06-14 ENCOUNTER — Emergency Department (HOSPITAL_COMMUNITY): Payer: BC Managed Care – PPO

## 2011-06-14 ENCOUNTER — Emergency Department (HOSPITAL_COMMUNITY)
Admission: EM | Admit: 2011-06-14 | Discharge: 2011-06-14 | Disposition: A | Discharge status: Home / Self Care | Payer: BC Managed Care – PPO | Attending: Emergency Medicine | Admitting: Emergency Medicine

## 2011-06-14 ENCOUNTER — Encounter (HOSPITAL_COMMUNITY): Payer: Self-pay | Admitting: Emergency Medicine

## 2011-06-14 DIAGNOSIS — R61 Generalized hyperhidrosis: Secondary | ICD-10-CM | POA: Insufficient documentation

## 2011-06-14 DIAGNOSIS — R0602 Shortness of breath: Secondary | ICD-10-CM | POA: Insufficient documentation

## 2011-06-14 DIAGNOSIS — R6889 Other general symptoms and signs: Secondary | ICD-10-CM | POA: Insufficient documentation

## 2011-06-14 DIAGNOSIS — R05 Cough: Secondary | ICD-10-CM | POA: Insufficient documentation

## 2011-06-14 DIAGNOSIS — R209 Unspecified disturbances of skin sensation: Secondary | ICD-10-CM | POA: Insufficient documentation

## 2011-06-14 DIAGNOSIS — I10 Essential (primary) hypertension: Secondary | ICD-10-CM | POA: Insufficient documentation

## 2011-06-14 DIAGNOSIS — J40 Bronchitis, not specified as acute or chronic: Secondary | ICD-10-CM

## 2011-06-14 DIAGNOSIS — R109 Unspecified abdominal pain: Secondary | ICD-10-CM | POA: Insufficient documentation

## 2011-06-14 DIAGNOSIS — R059 Cough, unspecified: Secondary | ICD-10-CM | POA: Insufficient documentation

## 2011-06-14 DIAGNOSIS — F341 Dysthymic disorder: Secondary | ICD-10-CM | POA: Insufficient documentation

## 2011-06-14 DIAGNOSIS — R000 Tachycardia, unspecified: Secondary | ICD-10-CM | POA: Insufficient documentation

## 2011-06-14 DIAGNOSIS — R079 Chest pain, unspecified: Secondary | ICD-10-CM | POA: Insufficient documentation

## 2011-06-14 HISTORY — DX: Other specified anxiety disorders: F41.8

## 2011-06-14 HISTORY — DX: Essential (primary) hypertension: I10

## 2011-06-14 LAB — URINALYSIS, ROUTINE W REFLEX MICROSCOPIC
Bilirubin Urine: NEGATIVE
Glucose, UA: NEGATIVE mg/dL
Hgb urine dipstick: NEGATIVE
Ketones, ur: NEGATIVE mg/dL
Protein, ur: NEGATIVE mg/dL

## 2011-06-14 LAB — CBC
HCT: 36.7 % (ref 36.0–46.0)
Hemoglobin: 12.1 g/dL (ref 12.0–15.0)
MCH: 29.2 pg (ref 26.0–34.0)
MCHC: 33 g/dL (ref 30.0–36.0)
MCV: 88.6 fL (ref 78.0–100.0)

## 2011-06-14 LAB — DIFFERENTIAL
Eosinophils Absolute: 0.1 10*3/uL (ref 0.0–0.7)
Eosinophils Relative: 1 % (ref 0–5)
Lymphs Abs: 1.2 10*3/uL (ref 0.7–4.0)
Monocytes Relative: 7 % (ref 3–12)

## 2011-06-14 LAB — BASIC METABOLIC PANEL
BUN: 14 mg/dL (ref 6–23)
Calcium: 8.5 mg/dL (ref 8.4–10.5)
Creatinine, Ser: 0.63 mg/dL (ref 0.50–1.10)
GFR calc non Af Amer: >90 mL/min (ref >90)
Glucose, Bld: 112 mg/dL — ABNORMAL HIGH (ref 70–99)
Sodium: 138 mEq/L (ref 135–145)

## 2011-06-14 LAB — POCT I-STAT, CHEM 8
BUN: 15 mg/dL (ref 6–23)
Creatinine, Ser: 0.7 mg/dL (ref 0.50–1.10)
Glucose, Bld: 111 mg/dL — ABNORMAL HIGH (ref 70–99)
Hemoglobin: 12.6 g/dL (ref 12.0–15.0)
TCO2: 24 mmol/L (ref 0–100)

## 2011-06-14 LAB — URINE MICROSCOPIC-ADD ON

## 2011-06-14 MED ORDER — AZITHROMYCIN 250 MG PO TABS: 500.0000 mg | ORAL_TABLET | Freq: Every day | ORAL | Status: AC

## 2011-06-14 NOTE — ED Provider Notes (Signed)
History     CSN: [id number]  Arrival date & time 06/14/11  1251   First MD Initiated Contact with Patient 06/14/11 1435      Chief Complaint  Patient presents with  . Chest Pain    (Consider location/radiation/quality/duration/timing/severity/associated sxs/prior treatment) HPI  51 year old female with history of hypertension, history of anxiety presents with chest pain.  Patient states for the past week she has had intermittent bouts of tingling sensation to her left arm that radiates to her left shoulder blade. This morning while sitting and talking to a friend patient notice a sharp sensation to her mid sternum. States pain is intense with associated diaphoresis, and shortness of breath. She proceeds to take four aspirins and called EMS. She has also received 2 SL nitroglycerin from EMS prior to arrival.  Is currently pain free.  Patient states she has not had any chest pain until this morning. She did complain of some mild running nose, sneezing, and coughing since this a.m. She denies fever, chills, productive cough, hemoptysis, nausea, vomiting, diarrhea, or abdominal pain. Patient denies any symptoms concerning for PE risk factors.  Patient mentioned that she has a history of kidney stones with prior lithotripsy procedure. She believes that she is having another kidney stone that is "acting up". She complains of pain to the left groin for the past several days, but denies urinary symptoms or blood per urine. Her last CT scan one year ago, her urologist is Dr. Abelardo Diesel.    Past Medical History  Diagnosis Date  . Hypertension   . Anxious depression     No past surgical history on file.  No family history on file.  History  Substance Use Topics  . Smoking status: Never Smoker   . Smokeless tobacco: Not on file  . Alcohol Use: No    OB History    Grav Para Term Preterm Abortions TAB SAB Ect Mult Living                  Review of Systems  All other systems reviewed and  are negative.    Allergies  Review of patient's allergies indicates no known allergies.  Home Medications   Current Outpatient Rx  Name Route Sig Dispense Refill  . IBUPROFEN 200 MG PO TABS Oral Take 400 mg by mouth every 8 (eight) hours as needed. For shortness of breath.    Marland Kitchen LISINOPRIL 20 MG PO TABS Oral Take 20 mg by mouth daily.    . VENLAFAXINE HCL ER 150 MG PO CP24 Oral Take 150 mg by mouth daily.      BP 128/75  Pulse 108  Temp(Src) 99.4 F (37.4 C) (Oral)  Resp 22  SpO2 98%  Physical Exam  Nursing note and vitals reviewed. Constitutional: She is oriented to person, place, and time. She appears well-developed and well-nourished. No distress.       Awake, alert, nontoxic appearance  HENT:  Head: Atraumatic.  Right Ear: External ear normal.  Left Ear: External ear normal.  Mouth/Throat: Oropharynx is clear and moist. No oropharyngeal exudate.  Eyes: Conjunctivae are normal. Right eye exhibits no discharge. Left eye exhibits no discharge.  Neck: Neck supple.  Cardiovascular: Regular rhythm.  Exam reveals no gallop and no friction rub.   No murmur heard.      Mild tachycardia noted  Pulmonary/Chest: Effort normal. No respiratory distress. She has no wheezes. She has no rales. She exhibits no tenderness.  Abdominal: Soft. There is no tenderness. There is  no rebound. No hernia. Hernia confirmed negative in the ventral area.       No CVA tenderness noted  Musculoskeletal: She exhibits no edema and no tenderness.       ROM appears intact, no obvious focal weakness. No reproducible left groin pain on examination.  Lymphadenopathy:    She has no cervical adenopathy.  Neurological: She is alert and oriented to person, place, and time.       Mental status and motor strength appears intact  Skin: Skin is warm. No rash noted.  Psychiatric: She has a normal mood and affect.    ED Course  Procedures (including critical care time)   Labs Reviewed  CBC  BASIC METABOLIC  PANEL  TROPONIN I   No results found.   No diagnosis found.   Date: 06/14/2011  Rate:98  Rhythm: normal sinus rhythm  QRS Axis: normal  Intervals: normal  ST/T Wave abnormalities: normal  Conduction Disutrbances:none  Narrative Interpretation:   Old EKG Reviewed: unchanged  Results for orders placed during the hospital encounter of 06/14/11  CBC      Component Value Range   WBC 10.3  4.0 - 10.5 (K/uL)   RBC 4.14  3.87 - 5.11 (MIL/uL)   Hemoglobin 12.1  12.0 - 15.0 (g/dL)   HCT 16.1  09.6 - 04.5 (%)   MCV 88.6  78.0 - 100.0 (fL)   MCH 29.2  26.0 - 34.0 (pg)   MCHC 33.0  30.0 - 36.0 (g/dL)   RDW 40.9  81.1 - 91.4 (%)   Platelets 318  150 - 400 (K/uL)  BASIC METABOLIC PANEL      Component Value Range   Sodium 138  135 - 145 (mEq/L)   Potassium 4.0  3.5 - 5.1 (mEq/L)   Chloride 103  96 - 112 (mEq/L)   CO2 24  19 - 32 (mEq/L)   Glucose, Bld 112 (*) 70 - 99 (mg/dL)   BUN 14  6 - 23 (mg/dL)   Creatinine, Ser 7.82  0.50 - 1.10 (mg/dL)   Calcium 8.5  8.4 - 95.6 (mg/dL)   GFR calc non Af Amer >90  >90 (mL/min)   GFR calc Af Amer >90  >90 (mL/min)  TROPONIN I      Component Value Range   Troponin I <0.30  <0.30 (ng/mL)  URINALYSIS, ROUTINE W REFLEX MICROSCOPIC      Component Value Range   Color, Urine YELLOW  YELLOW    APPearance CLOUDY (*) CLEAR    Specific Gravity, Urine 1.017  1.005 - 1.030    pH 6.0  5.0 - 8.0    Glucose, UA NEGATIVE  NEGATIVE (mg/dL)   Hgb urine dipstick NEGATIVE  NEGATIVE    Bilirubin Urine NEGATIVE  NEGATIVE    Ketones, ur NEGATIVE  NEGATIVE (mg/dL)   Protein, ur NEGATIVE  NEGATIVE (mg/dL)   Urobilinogen, UA 0.2  0.0 - 1.0 (mg/dL)   Nitrite NEGATIVE  NEGATIVE    Leukocytes, UA SMALL (*) NEGATIVE   DIFFERENTIAL      Component Value Range   Neutrophils Relative 79 (*) 43 - 77 (%)   Neutro Abs 7.9 (*) 1.7 - 7.7 (K/uL)   Lymphocytes Relative 12  12 - 46 (%)   Lymphs Abs 1.2  0.7 - 4.0 (K/uL)   Monocytes Relative 7  3 - 12 (%)   Monocytes  Absolute 0.7  0.1 - 1.0 (K/uL)   Eosinophils Relative 1  0 - 5 (%)   Eosinophils Absolute  0.1  0.0 - 0.7 (K/uL)   Basophils Relative 0  0 - 1 (%)   Basophils Absolute 0.0  0.0 - 0.1 (K/uL)  POCT I-STAT, CHEM 8      Component Value Range   Sodium 140  135 - 145 (mEq/L)   Potassium 4.0  3.5 - 5.1 (mEq/L)   Chloride 107  96 - 112 (mEq/L)   BUN 15  6 - 23 (mg/dL)   Creatinine, Ser 1.61  0.50 - 1.10 (mg/dL)   Glucose, Bld 096 (*) 70 - 99 (mg/dL)   Calcium, Ion 0.45  4.09 - 1.32 (mmol/L)   TCO2 24  0 - 100 (mmol/L)   Hemoglobin 12.6  12.0 - 15.0 (g/dL)   HCT 81.1  91.4 - 78.2 (%)  URINE MICROSCOPIC-ADD ON      Component Value Range   Squamous Epithelial / LPF MANY (*) RARE    WBC, UA 3-6  <3 (WBC/hpf)   Bacteria, UA MANY (*) RARE    Dg Chest 2 View  06/14/2011  *RADIOLOGY REPORT*  Clinical Data: Chest pain and shortness of breath.  CHEST - 2 VIEW  Comparison: 07/28/2010.  Findings: The cardiac silhouette, mediastinal and hilar contours are within normal limits and stable.  The lungs demonstrate mild bronchitic changes which appears stable.  No infiltrates, edema or effusions.  The bony thorax is intact.  IMPRESSION: Mild chronic bronchitic type changes but no acute pulmonary findings  Original Report Authenticated By: P. Loralie Champagne, M.D.      MDM  Patient with chest pain which started this morning. Nonexertional. There is some component suggestive of URI symptoms. However, patient state pain feels similar to the prior atypical chest pain when she was admitted last year. Patient has a cardiac catheterization done one year ago which shows minimal coronary plaque. Plan to perform a cardiac workup in the ED. Patient also concerned of kidney stones, however she has no CVA tenderness and no reproducible pain. Will check UA.   4:36 PM Patient is chest pain free. Her EKG is unremarkable. She has a negative troponin. She has recent cardiac cath shows minimal cardiovascular disease. Chest x-ray  shows evidence of bronchitis, which is consistent with her presenting symptoms. Her UA is unremarkable. I have low suspicion for kidney stones at this time. Plan to discharge patient with Z-Pak, and followup with her PCP. Strict return precautions discussed. I have discussed with my attending.       Fayrene Helper, PA-C 06/14/11 1638

## 2011-06-14 NOTE — Discharge Instructions (Signed)

## 2011-06-14 NOTE — ED Notes (Signed)
Per EMS, pt from home, pt reports mid-sternal cp that started ~ an hour ago.  Pt reports pain radiating to her L arm, also reports tingling in her L arm, but reports that tingling came on first before the cp.  Pt also has cold sxs at present.  Pt denies cp at this time.

## 2011-06-16 NOTE — ED Provider Notes (Signed)
Medical screening examination/treatment/procedure(s) were performed by non-physician practitioner and as supervising physician I was immediately available for consultation/collaboration.   Benny Lennert, MD 06/16/11 [phone]

## 2011-06-18 ENCOUNTER — Emergency Department (HOSPITAL_COMMUNITY): Payer: BC Managed Care – PPO

## 2011-06-18 ENCOUNTER — Encounter (HOSPITAL_COMMUNITY): Payer: Self-pay | Admitting: *Deleted

## 2011-06-18 ENCOUNTER — Emergency Department (HOSPITAL_COMMUNITY)
Admission: EM | Admit: 2011-06-18 | Discharge: 2011-06-18 | Disposition: A | Discharge status: Home / Self Care | Payer: BC Managed Care – PPO | Attending: Emergency Medicine | Admitting: Emergency Medicine

## 2011-06-18 DIAGNOSIS — E7800 Pure hypercholesterolemia, unspecified: Secondary | ICD-10-CM | POA: Insufficient documentation

## 2011-06-18 DIAGNOSIS — J40 Bronchitis, not specified as acute or chronic: Secondary | ICD-10-CM

## 2011-06-18 DIAGNOSIS — I10 Essential (primary) hypertension: Secondary | ICD-10-CM | POA: Insufficient documentation

## 2011-06-18 DIAGNOSIS — R05 Cough: Secondary | ICD-10-CM | POA: Insufficient documentation

## 2011-06-18 DIAGNOSIS — R059 Cough, unspecified: Secondary | ICD-10-CM | POA: Insufficient documentation

## 2011-06-18 DIAGNOSIS — R072 Precordial pain: Secondary | ICD-10-CM | POA: Insufficient documentation

## 2011-06-18 HISTORY — DX: Obesity, unspecified: E66.9

## 2011-06-18 HISTORY — DX: Pure hypercholesterolemia, unspecified: E78.00

## 2011-06-18 LAB — CBC
MCV: 88.9 fL (ref 78.0–100.0)
Platelets: 406 10*3/uL — ABNORMAL HIGH (ref 150–400)
RBC: 4.32 MIL/uL (ref 3.87–5.11)
RDW: 13.1 % (ref 11.5–15.5)
WBC: 12.1 10*3/uL — ABNORMAL HIGH (ref 4.0–10.5)

## 2011-06-18 LAB — D-DIMER, QUANTITATIVE: D-Dimer, Quant: 0.37 ug/mL-FEU (ref 0.00–0.48)

## 2011-06-18 LAB — BASIC METABOLIC PANEL
CO2: 25 mEq/L (ref 19–32)
Chloride: 100 mEq/L (ref 96–112)
Creatinine, Ser: 0.8 mg/dL (ref 0.50–1.10)
GFR calc Af Amer: >90 mL/min (ref >90)
Potassium: 3.9 mEq/L (ref 3.5–5.1)
Sodium: 139 mEq/L (ref 135–145)

## 2011-06-18 LAB — CARDIAC PANEL(CRET KIN+CKTOT+MB+TROPI)
CK, MB: 1.6 ng/mL (ref 0.3–4.0)
Relative Index: INVALID (ref 0.0–2.5)

## 2011-06-18 MED ORDER — ALBUTEROL SULFATE (5 MG/ML) 0.5% IN NEBU
5.0000 mg | INHALATION_SOLUTION | Freq: Once | RESPIRATORY_TRACT | Status: AC
  Administered 2011-06-18: 5 mg via RESPIRATORY_TRACT
  Filled 2011-06-18: qty 1

## 2011-06-18 NOTE — ED Notes (Addendum)
Patient transported to X-ray 

## 2011-06-18 NOTE — ED Provider Notes (Signed)
History     CSN: [id number]  Arrival date & time 06/18/11  1435   First MD Initiated Contact with Patient 06/18/11 1502      Chief Complaint  Patient presents with  . Bronchitis  . Chest Pain    (Consider location/radiation/quality/duration/timing/severity/associated sxs/prior treatment) HPI Pt presents with c/o cough and intermittent chest pain.  Pt was seen in ED several days ago, she was diagnosed with bronchitis.  She has since had frequent cough- minimal sputum.  Chest pain is sharp in located in midsternum.  No sob.  No nausea or diaphoresis.  No fever/chills.  Was started on zpack which she has finished.  Also saw her PMD today who gave albuterol inhaler, steroid IM, prednisone rx, cough syrup.  Of note, pt had negative cardiac eval in ED this week, reassuring cardiac cath in the past 3 years.   Pain is not associated with exertion, started while riding in car.  There are no other associated ssytemic symptoms, there are no alleviating or modifying factors.   Past Medical History  Diagnosis Date  . Hypertension   . Anxious depression   . Obesity   . Hypercholesteremia     Past Surgical History  Procedure Date  . Cholecystectomy   . Appendectomy   . Tonsillectomy     No family history on file.  History  Substance Use Topics  . Smoking status: Never Smoker   . Smokeless tobacco: Not on file  . Alcohol Use: No    OB History    Grav Para Term Preterm Abortions TAB SAB Ect Mult Living                  Review of Systems ROS reviewed and all otherwise negative except for mentioned in HPI  Allergies  Review of patient's allergies indicates no known allergies.  Home Medications   Current Outpatient Rx  Name Route Sig Dispense Refill  . ATORVASTATIN CALCIUM 40 MG PO TABS Oral Take 40 mg by mouth at bedtime.    . AZITHROMYCIN 250 MG PO TABS Oral Take 2 tablets (500 mg total) by mouth daily. 4 tablet 0  . IBUPROFEN 200 MG PO TABS Oral Take 400 mg by mouth every  8 (eight) hours as needed. For shortness of breath.    Marland Kitchen LISINOPRIL 20 MG PO TABS Oral Take 20 mg by mouth at bedtime.     . ADULT MULTIVITAMIN W/MINERALS CH Oral Take 1 tablet by mouth 2 (two) times a week.      BP 110/75  Pulse 86  Temp(Src) 98.3 F (36.8 C) (Oral)  Resp 20  SpO2 96% Vitals reviewed Physical Exam Physical Examination: General appearance - alert, well appearing, and in no distress Mental status - alert, oriented to person, place, and time Eyes - pupils equal and reactive, extraocular eye movements intact Mouth - mucous membranes moist, pharynx normal without lesions Chest - clear to auscultation, no wheezes, rales or rhonchi, symmetric air entry, in increased respiratory effort, frequent coughing Heart - normal rate, regular rhythm, normal S1, S2, no murmurs, rubs, clicks or gallops Abdomen - soft, nontender, nondistended, no masses or organomegaly Extremities - peripheral pulses normal, no pedal edema, no clubbing or cyanosis Skin - normal coloration and turgor, no rashes  ED Course  Procedures (including critical care time)   Date: 06/18/2011  Rate: 99  Rhythm: normal sinus rhythm  QRS Axis: normal  Intervals: normal  ST/T Wave abnormalities: normal  Conduction Disutrbances: none  Narrative Interpretation: unremarkable  Labs Reviewed  CBC - Abnormal; Notable for the following:    WBC 12.1 (*)    Platelets 406 (*)    All other components within normal limits  BASIC METABOLIC PANEL - Abnormal; Notable for the following:    Glucose, Bld 103 (*)    GFR calc non Af Amer 83 (*)    All other components within normal limits  CARDIAC PANEL(CRET KIN+CKTOT+MB+TROPI)  D-DIMER, QUANTITATIVE  LAB REPORT - SCANNED   Dg Chest 2 View  06/18/2011  *RADIOLOGY REPORT*  Clinical Data: Chest pain and bronchitis.  CHEST - 2 VIEW  Comparison: 06/14/2011  Findings: Two views of the chest were obtained.  There are slightly prominent lung markings which are  unchanged.  No focal airspace disease or edema.  Heart and mediastinum are within normal limits and the trachea is midline.  No evidence of pleural effusions  IMPRESSION: No acute chest findings.  Original Report Authenticated By: Richarda Overlie, M.D.     1. Bronchitis       MDM  Pt presenting with c/o frequent cough and sharp midsternal chest pain.  CXR reassuring, I agree with the diagnosis of bronchitis- she has been started on zithromax, albuterol, steroids.  No wheezing on exam.  D-dimer, EKG and cardiac markers are also reassuring.  Low suspicion for PE, ACS, dissection, or any other acute emergent condition.          Ethelda Chick, MD 06/19/11 [phone]

## 2011-06-18 NOTE — ED Notes (Signed)
EKG was wnl for ems

## 2011-06-18 NOTE — ED Notes (Signed)
Pt with recent diagnosis of bronchitis is here for sharp, non radiating, central chest pain.  Pt reports a cough which is productive for yellow sputum, no sob with this. Pt arrives with continuous dry cough

## 2011-06-18 NOTE — ED Notes (Addendum)
Family member arrived to visit.  Pt is still in radiology

## 2011-06-18 NOTE — Discharge Instructions (Signed)
Return to the ED with any concerns including difficulty breathing, leg swelling, fainting, vomiting and not able to keep down liquids, decreased level of alertness/lethargy, or any other alarming sympotms

## 2012-01-04 IMAGING — CR DG ABDOMEN 1V
1 series · 1 of 1 positions shown · non-contrast
Comparison: None

CLINICAL DATA: Left renal calculus.  Pre litho

ABDOMEN - 1 VIEW

[t abdomen supine]
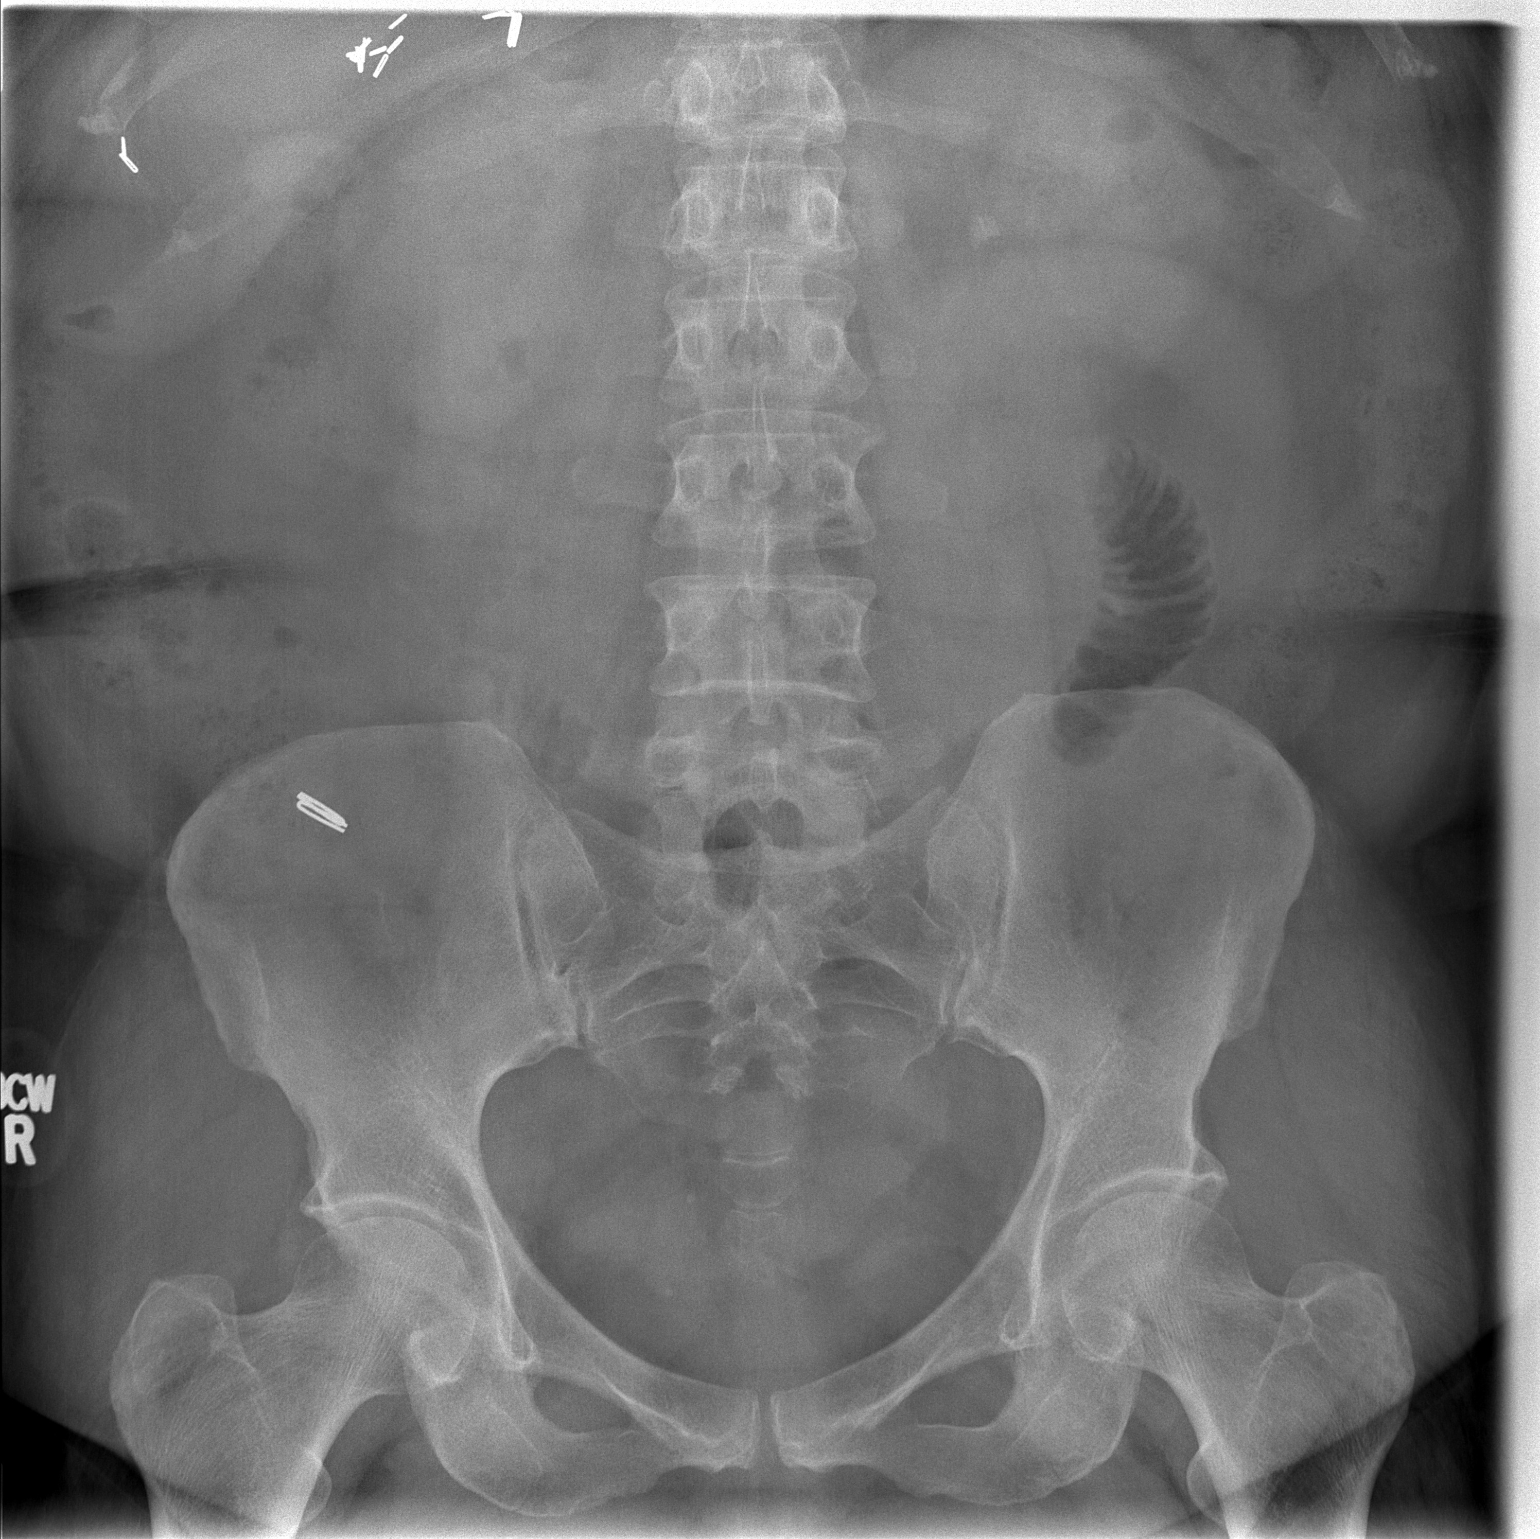

[1 of 1 positions shown; findings below may reference images not displayed]

FINDINGS: 6 mm calcification overlying the left kidney, compatible
with a renal calculus.  This is in the area of the renal pelvis.
No renal calculi on the right.  No ureteral calculi.  Small
calcified phleboliths in the right pelvis.

Nonobstructive bowel gas pattern.  Surgical clips in the right
upper quadrant and also overlying the right iliac crest.  No acute
bony abnormality.
IMPRESSION: 6 mm calcification overlying the left kidney compatible with renal
calculus.

## 2012-03-21 ENCOUNTER — Other Ambulatory Visit: Payer: Self-pay | Admitting: Urology

## 2012-03-22 ENCOUNTER — Encounter (HOSPITAL_BASED_OUTPATIENT_CLINIC_OR_DEPARTMENT_OTHER): Payer: Self-pay | Admitting: Anesthesiology

## 2012-03-22 ENCOUNTER — Encounter (HOSPITAL_BASED_OUTPATIENT_CLINIC_OR_DEPARTMENT_OTHER)
Admission: RE | Disposition: A | Discharge status: Home / Self Care | Payer: Self-pay | Source: Ambulatory Visit | Attending: Urology

## 2012-03-22 ENCOUNTER — Encounter (HOSPITAL_BASED_OUTPATIENT_CLINIC_OR_DEPARTMENT_OTHER): Payer: Self-pay | Admitting: *Deleted

## 2012-03-22 ENCOUNTER — Ambulatory Visit (HOSPITAL_BASED_OUTPATIENT_CLINIC_OR_DEPARTMENT_OTHER): Payer: BC Managed Care – PPO | Admitting: Anesthesiology

## 2012-03-22 ENCOUNTER — Ambulatory Visit (HOSPITAL_BASED_OUTPATIENT_CLINIC_OR_DEPARTMENT_OTHER)
Admission: RE | Admit: 2012-03-22 | Discharge: 2012-03-22 | Disposition: A | Discharge status: Home / Self Care | Payer: BC Managed Care – PPO | Source: Ambulatory Visit | Attending: Urology | Admitting: Urology

## 2012-03-22 DIAGNOSIS — I10 Essential (primary) hypertension: Secondary | ICD-10-CM | POA: Insufficient documentation

## 2012-03-22 DIAGNOSIS — Z79899 Other long term (current) drug therapy: Secondary | ICD-10-CM | POA: Insufficient documentation

## 2012-03-22 DIAGNOSIS — N201 Calculus of ureter: Secondary | ICD-10-CM

## 2012-03-22 DIAGNOSIS — E7800 Pure hypercholesterolemia, unspecified: Secondary | ICD-10-CM | POA: Insufficient documentation

## 2012-03-22 HISTORY — PX: HOLMIUM LASER APPLICATION: SHX5852

## 2012-03-22 HISTORY — PX: CYSTOSCOPY/RETROGRADE/URETEROSCOPY: SHX5316

## 2012-03-22 HISTORY — DX: Calculus of ureter: N20.1

## 2012-03-22 LAB — POCT HEMOGLOBIN-HEMACUE: Hemoglobin: 13.4 g/dL (ref 12.0–15.0)

## 2012-03-22 SURGERY — CYSTOSCOPY/RETROGRADE/URETEROSCOPY
Anesthesia: General | Site: Ureter | Laterality: Left | Wound class: Clean Contaminated

## 2012-03-22 MED ORDER — LACTATED RINGERS IV SOLN
INTRAVENOUS | Status: DC
  Administered 2012-03-22: 11:00:00 via INTRAVENOUS
  Filled 2012-03-22: qty 1000

## 2012-03-22 MED ORDER — FENTANYL CITRATE 0.05 MG/ML IJ SOLN
INTRAMUSCULAR | Status: DC | PRN
  Administered 2012-03-22 (×2): 50 ug via INTRAVENOUS

## 2012-03-22 MED ORDER — CIPROFLOXACIN IN D5W 400 MG/200ML IV SOLN
400.0000 mg | INTRAVENOUS | Status: AC
  Administered 2012-03-22: 400 mg via INTRAVENOUS
  Filled 2012-03-22: qty 200

## 2012-03-22 MED ORDER — PROPOFOL 10 MG/ML IV BOLUS
INTRAVENOUS | Status: DC | PRN
  Administered 2012-03-22: 300 mg via INTRAVENOUS

## 2012-03-22 MED ORDER — LIDOCAINE HCL 2 % EX GEL
CUTANEOUS | Status: DC | PRN
  Administered 2012-03-22: 1 via URETHRAL

## 2012-03-22 MED ORDER — PROMETHAZINE HCL 25 MG/ML IJ SOLN
6.2500 mg | INTRAMUSCULAR | Status: DC | PRN
  Filled 2012-03-22: qty 1

## 2012-03-22 MED ORDER — URIBEL 118 MG PO CAPS: 1.0000 | ORAL_CAPSULE | Freq: Three times a day (TID) | ORAL | Status: DC | PRN

## 2012-03-22 MED ORDER — SODIUM CHLORIDE 0.9 % IR SOLN
Status: DC | PRN
  Administered 2012-03-22: 3000 mL

## 2012-03-22 MED ORDER — KETOROLAC TROMETHAMINE 30 MG/ML IJ SOLN
INTRAMUSCULAR | Status: DC | PRN
  Administered 2012-03-22: 30 mg via INTRAVENOUS

## 2012-03-22 MED ORDER — LIDOCAINE HCL (CARDIAC) 20 MG/ML IV SOLN
INTRAVENOUS | Status: DC | PRN
  Administered 2012-03-22: 80 mg via INTRAVENOUS

## 2012-03-22 MED ORDER — KETOROLAC TROMETHAMINE 30 MG/ML IJ SOLN
15.0000 mg | Freq: Once | INTRAMUSCULAR | Status: DC | PRN
  Filled 2012-03-22: qty 1

## 2012-03-22 MED ORDER — ONDANSETRON HCL 4 MG/2ML IJ SOLN
INTRAMUSCULAR | Status: DC | PRN
  Administered 2012-03-22: 4 mg via INTRAVENOUS

## 2012-03-22 MED ORDER — FENTANYL CITRATE 0.05 MG/ML IJ SOLN
25.0000 ug | INTRAMUSCULAR | Status: DC | PRN
  Filled 2012-03-22: qty 1

## 2012-03-22 MED ORDER — HYDROCODONE-ACETAMINOPHEN 5-325 MG PO TABS: 1.0000 | ORAL_TABLET | Freq: Four times a day (QID) | ORAL | Status: DC | PRN

## 2012-03-22 MED ORDER — IOHEXOL 350 MG/ML SOLN
INTRAVENOUS | Status: DC | PRN
  Administered 2012-03-22: 10 mL via URETHRAL

## 2012-03-22 MED ORDER — BELLADONNA ALKALOIDS-OPIUM 16.2-60 MG RE SUPP
RECTAL | Status: DC | PRN
  Administered 2012-03-22: 1 via RECTAL

## 2012-03-22 MED ORDER — MIDAZOLAM HCL 5 MG/5ML IJ SOLN
INTRAMUSCULAR | Status: DC | PRN
  Administered 2012-03-22: 2 mg via INTRAVENOUS

## 2012-03-22 MED ORDER — DEXAMETHASONE SODIUM PHOSPHATE 4 MG/ML IJ SOLN
INTRAMUSCULAR | Status: DC | PRN
  Administered 2012-03-22: 10 mg via INTRAVENOUS

## 2012-03-22 MED ORDER — ACETAMINOPHEN 10 MG/ML IV SOLN
INTRAVENOUS | Status: DC | PRN
  Administered 2012-03-22: 1000 mg via INTRAVENOUS

## 2012-03-22 SURGICAL SUPPLY — 39 items
ADAPTER CATH URET PLST 4-6FR (CATHETERS) ×1 IMPLANT
ADPR CATH URET STRL DISP 4-6FR (CATHETERS) ×1
APL SKNCLS STERI-STRIP NONHPOA (GAUZE/BANDAGES/DRESSINGS) ×1
BAG DRAIN URO-CYSTO SKYTR STRL (DRAIN) ×2 IMPLANT
BAG DRN UROCATH (DRAIN) ×1
BASKET LASER NITINOL 1.9FR (BASKET) ×1 IMPLANT
BASKET STNLS GEMINI 4WIRE 3FR (BASKET) IMPLANT
BASKET ZERO TIP NITINOL 2.4FR (BASKET) IMPLANT
BENZOIN TINCTURE PRP APPL 2/3 (GAUZE/BANDAGES/DRESSINGS) ×1 IMPLANT
BSKT STON RTRVL 120 1.9FR (BASKET) ×1
BSKT STON RTRVL GEM 120X11 3FR (BASKET)
BSKT STON RTRVL ZERO TP 2.4FR (BASKET)
CANISTER SUCT LVC 12 LTR MEDI- (MISCELLANEOUS) ×2 IMPLANT
CATH INTERMIT  6FR 70CM (CATHETERS) ×1 IMPLANT
CATH URET 5FR 28IN CONE TIP (BALLOONS)
CATH URET 5FR 28IN OPEN ENDED (CATHETERS) IMPLANT
CATH URET 5FR 70CM CONE TIP (BALLOONS) IMPLANT
CLOTH BEACON ORANGE TIMEOUT ST (SAFETY) ×2 IMPLANT
DRAPE CAMERA CLOSED 9X96 (DRAPES) ×2 IMPLANT
DRSG TEGADERM 2-3/8X2-3/4 SM (GAUZE/BANDAGES/DRESSINGS) ×1 IMPLANT
GLOVE BIO SURGEON STRL SZ7.5 (GLOVE) ×2 IMPLANT
GLOVE ECLIPSE 7.0 STRL STRAW (GLOVE) ×1 IMPLANT
GLOVE INDICATOR 7.5 STRL GRN (GLOVE) ×1 IMPLANT
GOWN STRL REIN XL XLG (GOWN DISPOSABLE) ×2 IMPLANT
GUIDEWIRE 0.038 PTFE COATED (WIRE) IMPLANT
GUIDEWIRE ANG ZIPWIRE 038X150 (WIRE) IMPLANT
GUIDEWIRE STR DUAL SENSOR (WIRE) ×1 IMPLANT
IV NS IRRIG 3000ML ARTHROMATIC (IV SOLUTION) ×4 IMPLANT
KIT BALLIN UROMAX 15FX10 (LABEL) IMPLANT
KIT BALLN UROMAX 15FX4 (MISCELLANEOUS) IMPLANT
KIT BALLN UROMAX 26 75X4 (MISCELLANEOUS)
LASER FIBER DISP (UROLOGICAL SUPPLIES) IMPLANT
LASER FIBER DISP 1000U (UROLOGICAL SUPPLIES) IMPLANT
NS IRRIG 500ML POUR BTL (IV SOLUTION) IMPLANT
PACK CYSTOSCOPY (CUSTOM PROCEDURE TRAY) ×2 IMPLANT
SET HIGH PRES BAL DIL (LABEL)
SHEATH ACCESS URETERAL 38CM (SHEATH) IMPLANT
SHEATH ACCESS URETERAL 54CM (SHEATH) IMPLANT
STENT URET 6FRX24 CONTOUR (STENTS) ×1 IMPLANT

## 2012-03-22 NOTE — Anesthesia Preprocedure Evaluation (Addendum)
Anesthesia Evaluation  Patient identified by MRN, date of birth, ID band Patient awake    Reviewed: Allergy & Precautions, H&P , NPO status , Patient's Chart, lab work & pertinent test results  Airway Mallampati: II TM Distance: >3 FB Neck ROM: Full    Dental no notable dental hx.    Pulmonary neg pulmonary ROS,  breath sounds clear to auscultation  Pulmonary exam normal       Cardiovascular hypertension, Pt. on medications Rhythm:Regular Rate:Normal     Neuro/Psych negative neurological ROS  negative psych ROS   GI/Hepatic negative GI ROS, Neg liver ROS,   Endo/Other  Morbid obesity  Renal/GU negative Renal ROS  negative genitourinary   Musculoskeletal negative musculoskeletal ROS (+)   Abdominal   Peds negative pediatric ROS (+)  Hematology negative hematology ROS (+)   Anesthesia Other Findings   Reproductive/Obstetrics negative OB ROS                          Anesthesia Physical Anesthesia Plan  ASA: III  Anesthesia Plan: General   Post-op Pain Management:    Induction: Intravenous  Airway Management Planned: LMA  Additional Equipment:   Intra-op Plan:   Post-operative Plan:   Informed Consent: I have reviewed the patients History and Physical, chart, labs and discussed the procedure including the risks, benefits and alternatives for the proposed anesthesia with the patient or authorized representative who has indicated his/her understanding and acceptance.   Dental advisory given  Plan Discussed with: CRNA and Surgeon  Anesthesia Plan Comments:        Anesthesia Quick Evaluation

## 2012-03-22 NOTE — Transfer of Care (Signed)
Immediate Anesthesia Transfer of Care Note  Patient: Heidi Hogan  Procedure(s) Performed: Procedure(s) with comments: CYSTOSCOPY/RETROGRADE/URETEROSCOPY AND EXTRACTION OF LEFT STONE WITH BASKET AND LEFT URETERAL STENT PLACEMENT (Left) - 1 hour req for this case  Newman Nip  C[facility]  [phone] HOME [phone] CELL  [facility]   HOLMIUM LASER APPLICATION (Left)  Patient Location: PACU  Anesthesia Type:General  Level of Consciousness: awake and oriented  Airway & Oxygen Therapy: Patient Spontanous Breathing and Patient connected to nasal cannula oxygen  Post-op Assessment: Report given to PACU RN  Post vital signs: Reviewed and stable   Complications: No apparent anesthesia complications

## 2012-03-22 NOTE — Op Note (Signed)
Preoperative diagnosis: Left ureteral calculus  Postoperative diagnosis:  Multiple left ureteral calculi Procedure:  1. Cystoscopy 2. Left ureteroscopy and stone removal 3. Ureteroscopic laser lithotripsy 4. Left ureteral stent placement (6) 24 cm with dangle string 5. Left retrograde pyelography with interpretation  Surgeon: Valetta Fuller, MD  Anesthesia: General  Complications: None  Intraoperative findings: Left retrograde pyelography demonstrated to filling defects in the left distal ureter consistent with 2 distal ureteral calculi.  EBL: Minimal  Specimens: 1. Left ureteral calculi fragment  Disposition of specimens: [facility] for stone analysis  Indication: Heidi Hogan is a 52 y.o.   patient with urolithiasis. After reviewing the management options for treatment, the patient elected to proceed with the above surgical procedure(s). We have discussed the potential benefits and risks of the procedure, side effects of the proposed treatment, the likelihood of the patient achieving the goals of the procedure, and any potential problems that might occur during the procedure or recuperation. Informed consent has been obtained.  Description of procedure:  The patient was taken to the operating room and general anesthesia was induced.  The patient was placed in the dorsal lithotomy position, prepped and draped in the usual sterile fashion, and preoperative antibiotics were administered. A preoperative time-out was performed.   Cystourethroscopy was performed.  The bladder was then systematically examined in its entirety. There was no evidence for any bladder tumors, stones, or other mucosal pathology.    Attention then turned to the left ureteral orifice and a ureteral catheter was used to intubate the ureteral orifice.  Omnipaque contrast was injected through the ureteral catheter and a retrograde pyelogram was performed with findings as dictated above.  A  0.38 sensor guidewire was then advanced up the  ureter into the renal pelvis under fluoroscopic guidance. The 6 Fr semirigid ureteroscope was then advanced into the ureter next to the guidewire and the calculus was identified.   The stone was then fragmented with the 365 micron holmium laser fiber on a setting of 0.5J and frequency of 8 Hz. a second stone was then encountered a centimeter or 2 above the distal stone. Both these stones measured 5-7 mm in size.  All stones were then removed from the ureter with a zero tip nitinol basket.  Reinspection of the ureter revealed no remaining visible stones or fragments.   The wire was then backloaded through the cystoscope and a ureteral stent was advance over the wire using Seldinger technique.  The stent was positioned appropriately under fluoroscopic and cystoscopic guidance.  The wire was then removed with an adequate stent curl noted in the renal pelvis as well as in the bladder.  The bladder was then emptied and the procedure ended.  The patient appeared to tolerate the procedure well and without complications.  The patient was able to be awakened and transferred to the recovery unit in satisfactory condition.

## 2012-03-22 NOTE — H&P (Signed)
History of Present Illness   Heidi Hogan presents today as a more acute walk-in. She has had a history of nephrolithiasis with probably 15-20 clinical stones over her life. She has had several lithotripsies and also has had ureteroscopy in the past. She believes this current stone event started really around Christmas and has been going on for a couple of months. She was in to see Nash Dimmer in our office 3 weeks ago and was felt to have a probable distal ureteral stone. She had some mild hydronephrosis at that time. She has been unable to pass a stone and recently she needed to go to the [facility] Emergency Room because of increased pain and nausea. A recent stone protocol CT was performed and I have reviewed the images and report. She has a 4 x 7 mm stone in her distal left ureter causing at least mild obstruction. She has bilateral renal calculi, although realistically, there is not much on the right side. She has a multitude of scattered stones in the left kidney with the largest being about 4 mm in the lower pole and probably half a dozen stones in total. She is interested now in having something done as soon as possible.      Past Medical History Problems  1. History of  Anxiety (Symptom) 300.00 2. History of  Complete Colonoscopy 3. History of  Depression 311 4. History of  Hypercholesterolemia 272.0  Surgical History Problems  1. History of  Appendectomy 2. History of  Cholecystectomy 3. History of  Cystoscopy With Ureteroscopy With Removal Of Calculus 4. History of  Lithotripsy 5. History of  Lithotripsy  Current Meds 1. Oxycodone-Acetaminophen 5-325 MG Oral Tablet; TAKE 1 TO 2 TABLETS EVERY 4 TO 6 HOURS  AS NEEDED FOR PAIN; Therapy: 02Apr2012 to (Evaluate:05Feb2014); Last Rx:03Feb2014 2. Promethazine HCl 25 MG Oral Tablet; TAKE 1 TO 2 TABLETS EVERY 6 HOURS AS NEEDED FOR  NAUSEA AND VOMITING; Therapy: 25Apr2012 to (Evaluate:06Feb2014)  Requested for:  03Feb2014; Last Rx:03Feb2014 3.  Tamsulosin HCl 0.4 MG Oral Capsule; TAKE 1 CAPSULE Daily; Therapy: 03Feb2014 to  (Evaluate:04May2014)  Requested for: 03Feb2014; Last Rx:03Feb2014 4. Venlafaxine HCl ER 150 MG Oral Capsule Extended Release 24 Hour; Therapy: 24Jan2014 to  Allergies Medication  1. No Known Drug Allergies  Family History Problems  1. Sororal history of  Cholelithiasis 2. Fraternal history of  Cholelithiasis 3. Fraternal history of  Death In The Family Father 47 cancer 4. Fraternal history of  Death In The Family Mother 52, accident 5. Sororal history of  Diabetes Mellitus V18.0 6. Fraternal history of  Family Health Status Children ___ Living Daughters 1 7. Fraternal history of  Family Health Status Children ___ Living Sons 2 8. Fraternal history of  Hematuria 9. Sororal history of  Hematuria 10. Fraternal history of  Nephrolithiasis 11. Sororal history of  Nephrolithiasis  Social History Problems  1. Caffeine Use 5 per day 2. Marital History - Currently Married 3. Never A Smoker 4. Unemployed Denied  5. History of  Alcohol Use 6. History of  Tobacco Use  Review of Systems Genitourinary, constitutional, skin, eye, otolaryngeal, hematologic/lymphatic, cardiovascular, pulmonary, endocrine, musculoskeletal, gastrointestinal, neurological and psychiatric system(s) were reviewed and pertinent findings if present are noted.  Genitourinary: urinary frequency, urinary urgency, incontinence and urinary hesitancy, but no dysuria and no hematuria.  Gastrointestinal: nausea, flank pain and abdominal pain.  Constitutional: night sweats and feeling tired (fatigue).  Musculoskeletal: back pain and joint pain.  Neurological: headache.  Psychiatric: depression and anxiety.  Vitals Vital Signs [Data Includes: Last 1 Day]  25Feb2014 02:52PM  Blood Pressure: 164 / 88 Temperature: 98.6 F Heart Rate: 114  Physical Exam Constitutional: Well nourished and well developed . No acute distress.  Neck: The  appearance of the neck is normal and no neck mass is present.  Pulmonary: No respiratory distress and normal respiratory rhythm and effort.  Cardiovascular: Heart rate and rhythm are normal . No peripheral edema.  Abdomen: The abdomen is soft and nontender. No masses are palpated. No CVA tenderness. No hernias are palpable. No hepatosplenomegaly noted.  Skin: Normal skin turgor, no visible rash and no visible skin lesions.    Assessment Assessed  1. Chronic Cystitis 595.2 2. Nephrolithiasis 592.0 3. Distal Ureteral Stone On The Left 592.1  Plan Chronic Cystitis (595.2), Distal Ureteral Stone On The Left (592.1)  1. Ciprofloxacin HCl 250 MG Oral Tablet; TAKE 1 TABLET BID; Therapy: 25Feb2014 to (Last  Rx:25Feb2014) Distal Ureteral Stone On The Left (592.1)  2. Follow-up Schedule Surgery Office  Follow-up  Requested for: 25Feb2014  Discussion/Summary   Ms. Tindel appears to have had a distal left ureteral stone for probably 2 months. Fortunately, her kidney does not appear to be highly obstructed and therefore I doubt there has been any significant issues with regard to kidney function. At this point, I do think it is prudent to proceed with definitive stone management. We will see what we can do as far as getting her on the schedule for sometime this week if at all possible. I would like to culture her urine and empirically start her on some ciprofloxacin. She certainly is not having any clinical sign of cystitis or pyelonephritis at this time. Given the current location of the stone, I do think the most prudent way to proceed would be ureteroscopy with Holmium laser lithotripsy and probably several days of double-J stent drainage of that kidney.     Signatures Electronically signed by : Barron Alvine, M.D.; Mar 22 2012  7:48AM

## 2012-03-22 NOTE — Anesthesia Procedure Notes (Signed)
Procedure Name: LMA Insertion Date/Time: 03/22/2012 12:06 PM Performed by: Maris Berger T Pre-anesthesia Checklist: Patient identified, Emergency Drugs available, Suction available and Patient being monitored Patient Re-evaluated:Patient Re-evaluated prior to inductionOxygen Delivery Method: Circle System Utilized Preoxygenation: Pre-oxygenation with 100% oxygen Intubation Type: IV induction Ventilation: Mask ventilation without difficulty LMA: LMA inserted LMA Size: 5.0 Number of attempts: 1 Airway Equipment and Method: bite block Placement Confirmation: positive ETCO2 Dental Injury: Teeth and Oropharynx as per pre-operative assessment

## 2012-03-22 NOTE — Progress Notes (Signed)
Pt instructed to call the office if she has difficulty voiding or pain is unbearable. Pt to disregard initial post op visit( for stent removal) , but aware to keep the 2nd post op visit.

## 2012-03-23 ENCOUNTER — Encounter (HOSPITAL_BASED_OUTPATIENT_CLINIC_OR_DEPARTMENT_OTHER): Payer: Self-pay | Admitting: Urology

## 2012-03-23 NOTE — Anesthesia Postprocedure Evaluation (Signed)
  Anesthesia Post-op Note  Patient: Heidi Hogan  Procedure(s) Performed: Procedure(s) (LRB): CYSTOSCOPY/RETROGRADE/URETEROSCOPY AND EXTRACTION OF LEFT STONE WITH BASKET AND LEFT URETERAL STENT PLACEMENT (Left) HOLMIUM LASER APPLICATION (Left)  Patient Location: PACU  Anesthesia Type: General  Level of Consciousness: awake and alert   Airway and Oxygen Therapy: Patient Spontanous Breathing  Post-op Pain: mild  Post-op Assessment: Post-op Vital signs reviewed, Patient's Cardiovascular Status Stable, Respiratory Function Stable, Patent Airway and No signs of Nausea or vomiting  Last Vitals:  Filed Vitals:   03/22/12 1434  BP: 172/95  Pulse: 97  Temp: 36.7 C  Resp: 16    Post-op Vital Signs: stable   Complications: No apparent anesthesia complications

## 2012-05-18 ENCOUNTER — Other Ambulatory Visit: Payer: Self-pay

## 2012-05-18 DIAGNOSIS — Z1231 Encounter for screening mammogram for malignant neoplasm of breast: Secondary | ICD-10-CM

## 2012-05-26 ENCOUNTER — Ambulatory Visit (INDEPENDENT_AMBULATORY_CARE_PROVIDER_SITE_OTHER): Payer: BC Managed Care – PPO | Admitting: Gynecology

## 2012-05-26 ENCOUNTER — Encounter: Payer: Self-pay | Admitting: Gynecology

## 2012-05-26 DIAGNOSIS — R1032 Left lower quadrant pain: Secondary | ICD-10-CM

## 2012-05-26 NOTE — Progress Notes (Signed)
Two-week history of left lower quadrant pain. Intermittent wavelike with associated nausea. Sometimes precipitated by movement. Does have history of renal lithiasis with recent lithotripsy in February. Soft tissues having another stone he just saw the urologist where she reports verbally CT scan was negative. Also has history of laparoscopy 2006 where she had left-sided omental adhesions which were lysed and the described several areas of endometriosis that were fulgurated.  Exam with Selena Batten assistant Spine straight without CVA tenderness. Abdomen with active bowel sounds, soft nontender without masses guarding rebound organomegaly. Pelvic external BUS vagina normal. Cervix normal. Uterus normal size midline mobile nontender. Adnexa without gross masses or tenderness. Rectovaginal exam normal.  Assessment and plan: Two-week history of left lower quadrant pain with some associated nausea. Reports regular bowel movements. Is being treated with Nexium for GERD but not having consistent reflux or other symptoms. Not a clear picture having had renal lithiasis apparently ruled out. Doubt endometriosis as she is postmenopausal. 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 Will start with ultrasound. Possible GI referral if ultrasound negative and pain persists. Alternatives such as diverticulitis reviewed. Not sure that explains the nausea though. We'll check baseline CBC for white count also.

## 2012-05-26 NOTE — Patient Instructions (Addendum)
Follow up for ultrasound as scheduled 

## 2012-05-27 LAB — CBC WITH DIFFERENTIAL/PLATELET
Basophils Absolute: 0 10*3/uL (ref 0.0–0.1)
HCT: 41.1 % (ref 36.0–46.0)
Hemoglobin: 13.9 g/dL (ref 12.0–15.0)
Lymphocytes Relative: 25 % (ref 12–46)
Monocytes Absolute: 0.6 10*3/uL (ref 0.1–1.0)
Monocytes Relative: 6 % (ref 3–12)
Neutro Abs: 6.3 10*3/uL (ref 1.7–7.7)
RDW: 14 % (ref 11.5–15.5)
WBC: 9.5 10*3/uL (ref 4.0–10.5)

## 2012-06-06 ENCOUNTER — Ambulatory Visit
Admission: RE | Admit: 2012-06-06 | Discharge: 2012-06-06 | Disposition: A | Discharge status: Home / Self Care | Payer: BC Managed Care – PPO | Source: Ambulatory Visit

## 2012-06-06 DIAGNOSIS — Z1231 Encounter for screening mammogram for malignant neoplasm of breast: Secondary | ICD-10-CM

## 2012-06-23 ENCOUNTER — Other Ambulatory Visit: Payer: BC Managed Care – PPO

## 2012-06-23 ENCOUNTER — Encounter: Payer: Self-pay | Admitting: Gynecology

## 2012-11-30 ENCOUNTER — Other Ambulatory Visit: Payer: Self-pay

## 2013-05-31 ENCOUNTER — Other Ambulatory Visit: Payer: Self-pay

## 2013-05-31 DIAGNOSIS — Z1231 Encounter for screening mammogram for malignant neoplasm of breast: Secondary | ICD-10-CM

## 2013-06-22 ENCOUNTER — Ambulatory Visit
Admission: RE | Admit: 2013-06-22 | Discharge: 2013-06-22 | Disposition: A | Discharge status: Home / Self Care | Payer: 59 | Source: Ambulatory Visit

## 2013-06-22 DIAGNOSIS — Z1231 Encounter for screening mammogram for malignant neoplasm of breast: Secondary | ICD-10-CM

## 2013-11-26 ENCOUNTER — Encounter: Payer: Self-pay | Admitting: Gynecology

## 2014-09-18 ENCOUNTER — Other Ambulatory Visit: Payer: Self-pay

## 2015-05-22 ENCOUNTER — Emergency Department (HOSPITAL_BASED_OUTPATIENT_CLINIC_OR_DEPARTMENT_OTHER)
Admit: 2015-05-22 | Discharge: 2015-05-22 | Disposition: A | Discharge status: Home / Self Care | Payer: 59 | Attending: Emergency Medicine | Admitting: Emergency Medicine

## 2015-05-22 ENCOUNTER — Emergency Department (HOSPITAL_COMMUNITY)
Admission: EM | Admit: 2015-05-22 | Discharge: 2015-05-22 | Disposition: A | Discharge status: Home / Self Care | Payer: 59 | Attending: Emergency Medicine | Admitting: Emergency Medicine

## 2015-05-22 ENCOUNTER — Encounter (HOSPITAL_COMMUNITY): Payer: Self-pay | Admitting: Emergency Medicine

## 2015-05-22 DIAGNOSIS — Z8659 Personal history of other mental and behavioral disorders: Secondary | ICD-10-CM | POA: Diagnosis not present

## 2015-05-22 DIAGNOSIS — Z87442 Personal history of urinary calculi: Secondary | ICD-10-CM | POA: Diagnosis not present

## 2015-05-22 DIAGNOSIS — E669 Obesity, unspecified: Secondary | ICD-10-CM | POA: Insufficient documentation

## 2015-05-22 DIAGNOSIS — M79604 Pain in right leg: Secondary | ICD-10-CM | POA: Diagnosis present

## 2015-05-22 DIAGNOSIS — Z79899 Other long term (current) drug therapy: Secondary | ICD-10-CM | POA: Insufficient documentation

## 2015-05-22 DIAGNOSIS — M1990 Unspecified osteoarthritis, unspecified site: Secondary | ICD-10-CM | POA: Insufficient documentation

## 2015-05-22 DIAGNOSIS — I10 Essential (primary) hypertension: Secondary | ICD-10-CM | POA: Insufficient documentation

## 2015-05-22 DIAGNOSIS — E782 Mixed hyperlipidemia: Secondary | ICD-10-CM | POA: Diagnosis not present

## 2015-05-22 DIAGNOSIS — M7989 Other specified soft tissue disorders: Secondary | ICD-10-CM | POA: Diagnosis not present

## 2015-05-22 DIAGNOSIS — M79606 Pain in leg, unspecified: Secondary | ICD-10-CM

## 2015-05-22 HISTORY — DX: Unspecified osteoarthritis, unspecified site: M19.90

## 2015-05-22 LAB — I-STAT CHEM 8, ED
BUN: 22 mg/dL — ABNORMAL HIGH (ref 6–20)
CREATININE: 0.6 mg/dL (ref 0.44–1.00)
Calcium, Ion: 1.13 mmol/L (ref 1.12–1.23)
Chloride: 105 mmol/L (ref 101–111)
Glucose, Bld: 104 mg/dL — ABNORMAL HIGH (ref 65–99)
HEMATOCRIT: 39 % (ref 36.0–46.0)
HEMOGLOBIN: 13.3 g/dL (ref 12.0–15.0)
POTASSIUM: 4.3 mmol/L (ref 3.5–5.1)
SODIUM: 141 mmol/L (ref 135–145)
TCO2: 22 mmol/L (ref 0–100)

## 2015-05-22 MED ORDER — KETOROLAC TROMETHAMINE 60 MG/2ML IM SOLN
30.0000 mg | Freq: Once | INTRAMUSCULAR | Status: AC
  Administered 2015-05-22: 30 mg via INTRAMUSCULAR
  Filled 2015-05-22: qty 2

## 2015-05-22 NOTE — ED Notes (Signed)
Pt in reporting L calf pain and R thigh pain. States she has hx arthritis but this does not feel the same. Rates at 8/10 describes as aching. Was in car for 27 hrs this past weekend. No hx blood clots. No redness, no swelling.

## 2015-05-22 NOTE — ED Provider Notes (Signed)
CSN: [id number]     Arrival date & time 05/22/15  [id number] History   First MD Initiated Contact with Patient 05/22/15 [id number]     Chief Complaint  Patient presents with  . Leg Pain     (Consider location/radiation/quality/duration/timing/severity/associated sxs/prior Treatment) Patient is a 55 y.o. female presenting with leg pain.  Leg Pain Lower extremity pain location: left lateral ankle and right medial thigh. Injury: no   Pain details:    Quality:  Aching and dull   Radiates to:  Does not radiate   Severity:  Mild   Duration:  1 day   Timing:  Constant Chronicity:  New Dislocation: no   Prior injury to area:  No Relieved by:  Nothing Worsened by:  Activity Associated symptoms: no back pain, no fever, no itching and no muscle weakness     Past Medical History  Diagnosis Date  . Hypertension   . Anxious depression   . Obesity   . Hypercholesteremia   . Endometriosis   . Kidney stones   . Arthritis    Past Surgical History  Procedure Laterality Date  . Cholecystectomy    . Appendectomy    . Tonsillectomy    . Cystoscopy w/ ureteroscopy  2012  . Cystoscopy/retrograde/ureteroscopy Left 03/22/2012    Procedure: CYSTOSCOPY/RETROGRADE/URETEROSCOPY AND EXTRACTION OF LEFT STONE WITH BASKET AND LEFT URETERAL STENT PLACEMENT;  Surgeon: Valetta Fulleravid S Grapey, MD;  [facility] [place];  Service: Urology;  Laterality: Left;  1 hour req for this case  [facility]ip[facility] STENT  [facility]  [phone] HOME [phone] CELL  [facility]    . Holmium laser application Left 03/22/2012    Procedure: HOLMIUM LASER APPLICATION;  Surgeon: Valetta Fulleravid S Grapey, MD;  [facility] [place];  Service: Urology;  Laterality: Left;  . Cesarean section    . Lithotripsy    . Pelvic laparoscopy      DL lysis of adhesions   Family History  Problem Relation Age of Onset  . Diabetes Sister   . Hypertension Sister   . Cancer Daughter     Cervical    Social History  Substance Use Topics  .  Smoking status: Never Smoker   . Smokeless tobacco: None  . Alcohol Use: No   OB History    Gravida Para Term Preterm AB TAB SAB Ectopic Multiple Living   5 3 3  2     3      Review of Systems  Constitutional: Negative for fever and chills.  Eyes: Negative for pain.  Musculoskeletal: Negative for back pain, joint swelling and gait problem.  Skin: Negative for itching.  All other systems reviewed and are negative.     Allergies  Review of patient's allergies indicates no known allergies.  Home Medications   Prior to Admission medications   Medication Sig Start Date End Date Taking? Authorizing Provider  cholecalciferol (VITAMIN D) 1000 units tablet Take 2,000 Units by mouth daily.   Yes Historical Provider, MD  ibuprofen (ADVIL,MOTRIN) 200 MG tablet Take 400 mg by mouth every 8 (eight) hours as needed for moderate pain.    Yes Historical Provider, MD  Meth-Hyo-M Bl-Na Phos-Ph Sal (URIBEL) 118 MG CAPS Take 1 capsule (118 mg total) by mouth 3 (three) times daily as needed. Patient taking differently: Take 1 capsule by mouth 3 (three) times daily as needed (pain when urinating).  03/22/12  Yes Barron Alvineavid Grapey, MD  naproxen sodium (ANAPROX) 220 MG tablet Take 440 mg by mouth 2 (two) times  daily as needed (pain).   Yes Historical Provider, MD  venlafaxine XR (EFFEXOR-XR) 150 MG 24 hr capsule Take 150 mg by mouth daily.   Yes Historical Provider, MD   BP 188/90 mmHg  Pulse 70  Temp(Src) 98.8 F (37.1 C) (Oral)  Resp 17  SpO2 99% Physical Exam  Constitutional: She appears well-developed and well-nourished.  HENT:  Head: Normocephalic and atraumatic.  Neck: Normal range of motion.  Cardiovascular: Normal rate and regular rhythm.   Pulmonary/Chest: Effort normal. No stridor. No respiratory distress.  Abdominal: Soft. She exhibits no distension.  Musculoskeletal: Normal range of motion. She exhibits edema (minimal in left lower letg).  Neurological: She is alert.  Nursing note and  vitals reviewed.   ED Course  Procedures (including critical care time) Labs Review Labs Reviewed  I-STAT CHEM 8, ED - Abnormal; Notable for the following:    BUN 22 (*)    Glucose, Bld 104 (*)    All other components within normal limits    Imaging Review No results found. I have personally reviewed and evaluated these images and lab results as part of my medical decision-making.   EKG Interpretation None      MDM   Final diagnoses:  Pain of lower extremity, unspecified laterality   Likely muscular, but with recent car ride will [place] to ensure no DVT. No recent increase in activity or trauma to suggest fracture. Will check lytes as well. toradol for pain.  DVT studies negative. Can continue management as an outpatient.   New Prescriptions: New Prescriptions   No medications on file    I have personally and contemperaneously reviewed labs and imaging and used in my decision making as above.   A medical screening exam was performed and I feel the patient has had an appropriate workup for their chief complaint at this time and likelihood of emergent condition existing is low and thus workup can continue on an outpatient basis. Their vital signs are stable. They have been counseled on decision, discharge, follow up and which symptoms necessitate immediate return to the emergency department.  They verbally stated understanding and agreement with plan and discharged in stable condition.      Marily Memos, MD 05/22/15 0900

## 2015-05-22 NOTE — Progress Notes (Signed)
*  PRELIMINARY RESULTS* Vascular Ultrasound Lower extremity venous duplex has been completed.  Preliminary findings: No evidence of DVT or baker's cyst.   Farrel DemarkJill Eunice, RDMS, RVT  05/22/2015, 8:20 AM

## 2015-08-07 ENCOUNTER — Other Ambulatory Visit: Payer: Self-pay | Admitting: Family Medicine

## 2015-08-07 DIAGNOSIS — Z1231 Encounter for screening mammogram for malignant neoplasm of breast: Secondary | ICD-10-CM

## 2015-08-21 ENCOUNTER — Ambulatory Visit: Payer: 59

## 2018-01-25 DIAGNOSIS — M8580 Other specified disorders of bone density and structure, unspecified site: Secondary | ICD-10-CM | POA: Insufficient documentation

## 2018-01-25 HISTORY — DX: Other specified disorders of bone density and structure, unspecified site: M85.80

## 2018-04-04 ENCOUNTER — Other Ambulatory Visit: Payer: Self-pay | Admitting: Internal Medicine

## 2018-04-04 DIAGNOSIS — Z1231 Encounter for screening mammogram for malignant neoplasm of breast: Secondary | ICD-10-CM

## 2018-05-03 ENCOUNTER — Ambulatory Visit: Payer: 59

## 2018-06-08 ENCOUNTER — Other Ambulatory Visit: Payer: Self-pay | Admitting: Internal Medicine

## 2018-06-08 DIAGNOSIS — M8580 Other specified disorders of bone density and structure, unspecified site: Secondary | ICD-10-CM

## 2018-06-09 DIAGNOSIS — K5792 Diverticulitis of intestine, part unspecified, without perforation or abscess without bleeding: Secondary | ICD-10-CM | POA: Insufficient documentation

## 2018-06-09 DIAGNOSIS — E782 Mixed hyperlipidemia: Secondary | ICD-10-CM | POA: Insufficient documentation

## 2018-06-09 HISTORY — DX: Diverticulitis of intestine, part unspecified, without perforation or abscess without bleeding: K57.92

## 2018-06-09 HISTORY — DX: Mixed hyperlipidemia: E78.2

## 2018-08-02 ENCOUNTER — Ambulatory Visit
Admission: RE | Admit: 2018-08-02 | Discharge: 2018-08-02 | Disposition: A | Discharge status: Home / Self Care | Payer: 59 | Source: Ambulatory Visit | Attending: Internal Medicine | Admitting: Internal Medicine

## 2018-08-02 DIAGNOSIS — Z1231 Encounter for screening mammogram for malignant neoplasm of breast: Secondary | ICD-10-CM

## 2018-08-02 DIAGNOSIS — M8580 Other specified disorders of bone density and structure, unspecified site: Secondary | ICD-10-CM

## 2018-08-03 ENCOUNTER — Other Ambulatory Visit: Payer: Self-pay | Admitting: Internal Medicine

## 2018-08-03 DIAGNOSIS — R928 Other abnormal and inconclusive findings on diagnostic imaging of breast: Secondary | ICD-10-CM

## 2018-08-07 ENCOUNTER — Ambulatory Visit
Admission: RE | Admit: 2018-08-07 | Discharge: 2018-08-07 | Disposition: A | Discharge status: Home / Self Care | Payer: 59 | Source: Ambulatory Visit | Attending: Internal Medicine | Admitting: Internal Medicine

## 2018-08-07 ENCOUNTER — Other Ambulatory Visit: Payer: Self-pay | Admitting: Internal Medicine

## 2018-08-07 ENCOUNTER — Other Ambulatory Visit: Payer: Self-pay

## 2018-08-07 DIAGNOSIS — N6320 Unspecified lump in the left breast, unspecified quadrant: Secondary | ICD-10-CM

## 2018-08-07 DIAGNOSIS — N63 Unspecified lump in unspecified breast: Secondary | ICD-10-CM

## 2018-08-07 DIAGNOSIS — R928 Other abnormal and inconclusive findings on diagnostic imaging of breast: Secondary | ICD-10-CM

## 2018-08-07 HISTORY — DX: Unspecified lump in unspecified breast: N63.0

## 2018-08-29 ENCOUNTER — Other Ambulatory Visit: Payer: 59

## 2018-09-01 ENCOUNTER — Other Ambulatory Visit: Payer: Self-pay

## 2018-09-04 ENCOUNTER — Encounter: Payer: Self-pay | Admitting: Gynecology

## 2018-09-04 ENCOUNTER — Ambulatory Visit: Payer: Self-pay | Admitting: Gynecology

## 2018-09-04 ENCOUNTER — Other Ambulatory Visit: Payer: Self-pay

## 2018-09-04 VITALS — BP 122/76 | Ht 64.0 in | Wt 246.0 lb

## 2018-09-04 DIAGNOSIS — M8580 Other specified disorders of bone density and structure, unspecified site: Secondary | ICD-10-CM

## 2018-09-04 DIAGNOSIS — N952 Postmenopausal atrophic vaginitis: Secondary | ICD-10-CM | POA: Diagnosis not present

## 2018-09-04 DIAGNOSIS — Z1151 Encounter for screening for human papillomavirus (HPV): Secondary | ICD-10-CM | POA: Diagnosis not present

## 2018-09-04 DIAGNOSIS — Z01419 Encounter for gynecological examination (general) (routine) without abnormal findings: Secondary | ICD-10-CM | POA: Diagnosis not present

## 2018-09-04 NOTE — Addendum Note (Signed)
Addended by: Nelva Nay on: 09/04/2018 11:45 AM   Modules accepted: Orders

## 2018-09-04 NOTE — Patient Instructions (Signed)
Follow-up in 1 year for annual exam, sooner if any issues. 

## 2018-09-04 NOTE — Progress Notes (Signed)
    Navah Grondin 04-07-1960 [id number]        58 y.o.  [id number] for annual gynecologic exam.  Former patient of Dr. Cherylann Banas.  Without gynecologic complaints.  Past medical history,surgical history, problem list, medications, allergies, family history and social history were all reviewed and documented as reviewed in the EPIC chart.  ROS:  Performed with pertinent positives and negatives included in the history, assessment and plan.   Additional significant findings : None   Exam: Caryn Bee assistant Vitals:   09/04/18 1023  BP: 122/76  Weight: 246 lb (111.6 kg)  Height: 5\' 4"  (1.626 m)   Body mass index is 42.23 kg/m.  General appearance:  Normal affect, orientation and appearance. Skin: Grossly normal HEENT: Without gross lesions.  No cervical or supraclavicular adenopathy. Thyroid normal.  Lungs:  Clear without wheezing, rales or rhonchi Cardiac: RR, without RMG Abdominal:  Soft, nontender, without masses, guarding, rebound, organomegaly or hernia Breasts:  Examined lying and sitting without masses, retractions, discharge or axillary adenopathy. Pelvic:  Ext, BUS, Vagina: With atrophic changes  Cervix: With atrophic changes  Uterus: Difficult to palpate but no gross masses or tenderness  Adnexa: Without masses or tenderness    Anus and perineum: Normal   Rectovaginal: Normal sphincter tone without palpated masses or tenderness.    Assessment/Plan:  58 y.o. [id number] female for annual gynecologic exam.   1. Postmenopausal.  No significant menopausal symptoms or any vaginal bleeding.  Notes some increase in her anxiety.  Currently on Effexor.  Recommended she follow-up with her primary physician who is prescribing to discuss. 2. Pap smear 2012.  Pap smear/HPV today.  No history of significant abnormal Pap smears. 3. Mammography 07/2018.  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  They recommend a six-month follow-up ultrasound and I reminded her of this.   Breast exam normal today. 4. Colonoscopy 2020.  Repeat at their recommended interval. 5. Osteopenia.  DEXA 2020 T score -1.9 FRAX 13% / 1.5%.  Recommend follow-up DEXA at 2-year interval. 6. Health maintenance.  Actively being followed for kidney stones with recent lithotripsy and surgery.  We will continue to follow-up with them in reference to this.  No routine lab work done as she does this through her primary provider.  Follow-up 1 year, sooner as needed.   Anastasio Auerbach MD, 10:56 AM 09/04/2018

## 2018-09-06 LAB — PAP IG AND HPV HIGH-RISK: HPV DNA High Risk: NOT DETECTED

## 2018-10-24 ENCOUNTER — Encounter: Payer: Self-pay | Admitting: Gynecology

## 2019-02-08 ENCOUNTER — Other Ambulatory Visit: Payer: Self-pay | Admitting: Internal Medicine

## 2019-02-08 ENCOUNTER — Ambulatory Visit
Admission: RE | Admit: 2019-02-08 | Discharge: 2019-02-08 | Disposition: A | Discharge status: Home / Self Care | Payer: 59 | Source: Ambulatory Visit | Attending: Internal Medicine | Admitting: Internal Medicine

## 2019-02-08 ENCOUNTER — Other Ambulatory Visit: Payer: Self-pay

## 2019-02-08 DIAGNOSIS — N6320 Unspecified lump in the left breast, unspecified quadrant: Secondary | ICD-10-CM

## 2019-08-01 ENCOUNTER — Encounter: Payer: Self-pay | Admitting: Cardiology

## 2019-08-01 ENCOUNTER — Other Ambulatory Visit: Payer: Self-pay | Admitting: Cardiology

## 2019-08-01 ENCOUNTER — Other Ambulatory Visit: Payer: Self-pay

## 2019-08-01 ENCOUNTER — Ambulatory Visit: Payer: 59 | Admitting: Cardiology

## 2019-08-01 DIAGNOSIS — R011 Cardiac murmur, unspecified: Secondary | ICD-10-CM

## 2019-08-01 DIAGNOSIS — N6320 Unspecified lump in the left breast, unspecified quadrant: Secondary | ICD-10-CM

## 2019-08-01 DIAGNOSIS — E088 Diabetes mellitus due to underlying condition with unspecified complications: Secondary | ICD-10-CM

## 2019-08-01 DIAGNOSIS — R002 Palpitations: Secondary | ICD-10-CM

## 2019-08-01 DIAGNOSIS — E782 Mixed hyperlipidemia: Secondary | ICD-10-CM

## 2019-08-01 DIAGNOSIS — R072 Precordial pain: Secondary | ICD-10-CM

## 2019-08-01 DIAGNOSIS — R079 Chest pain, unspecified: Secondary | ICD-10-CM

## 2019-08-01 DIAGNOSIS — I10 Essential (primary) hypertension: Secondary | ICD-10-CM

## 2019-08-01 HISTORY — DX: Chest pain, unspecified: R07.9

## 2019-08-01 HISTORY — DX: Palpitations: R00.2

## 2019-08-01 HISTORY — DX: Morbid (severe) obesity due to excess calories: E66.01

## 2019-08-01 HISTORY — DX: Mixed hyperlipidemia: E78.2

## 2019-08-01 HISTORY — DX: Cardiac murmur, unspecified: R01.1

## 2019-08-01 HISTORY — DX: Essential (primary) hypertension: I10

## 2019-08-01 HISTORY — DX: Diabetes mellitus due to underlying condition with unspecified complications: E08.8

## 2019-08-01 NOTE — Progress Notes (Signed)
Cardiology Office Note:    Date:  08/01/2019   ID:  Heidi Hogan, DOB 06-02-1960, MRN [id number]  PCP:  Lucianne Lei, MD  Cardiologist:  Garwin Brothers, MD   Referring MD: Lucianne Lei, MD    ASSESSMENT:    1. Chest pain, unspecified type   2. Essential hypertension   3. Diabetes mellitus due to underlying condition with unspecified complications (HCC)   4. Mixed dyslipidemia   5. Palpitations   6. Morbid obesity (HCC)   7. Cardiac murmur    PLAN:    In order of problems listed above:  1. Primary prevention stressed with the patient.  Importance of compliance with diet medication stressed and she vocalized understanding 2. Chest discomfort: Symptoms are atypical however in view of multiple risk factors we will do a Lexiscan sestamibi.  She knows to go to nearest emergency room for any significant concerns. 3. Palpitations: TSH is fine I reviewed lab work and records from primary care physician.  We will do a 2-week monitor to assess the symptoms 4. Mixed dyslipidemia and diabetes mellitus: Diet was emphasized.  She agrees to do better. 5. Morbid obesity: Risks of obesity explained to the patient at extensive length and diet was emphasized and she is going to do better.  If her stress test is negative then she will start an exercise program in a graded fashion. 6. Cardiac murmur: Echocardiogram will be done to assess this. 7. Patient will be seen in follow-up appointment in 6 months or earlier if the patient has any concerns    Medication Adjustments/Labs and Tests Ordered: Current medicines are reviewed at length with the patient today.  Concerns regarding medicines are outlined above.  No orders of the defined types were placed in this encounter.  No orders of the defined types were placed in this encounter.    History of Present Illness:    Heidi Hogan is a 59 y.o. female who is being seen today for the evaluation of chest discomfort and palpitations at the  request of Lucianne Lei, MD.  Patient is a pleasant 59 year old female.  She has past medical history of essential hypertension dyslipidemia diabetes mellitus and obesity.  She leads a sedentary lifestyle.  She mentions to me that she occasionally has chest discomfort not related to exertion.  No orthopnea or PND.  No radiation to any part of the body.  She does take all medications on a regular basis.  She takes a coated baby aspirin on a regular basis also.  She also feels palpitations at times.  This is not related to exertion.  No syncope or dizziness.  At the time of my evaluation, the patient is alert awake oriented and in no distress.  Past Medical History:  Diagnosis Date  . Anxious depression   . Arthritis   . Diabetes mellitus without complication (HCC)   . Hypercholesteremia   . Hypertension   . Kidney stones   . Obesity   . Osteopenia 2020   T score -1.9 FRAX 13% / 1.5%    Past Surgical History:  Procedure Laterality Date  . APPENDECTOMY    . CESAREAN SECTION    . CHOLECYSTECTOMY    . CYSTOSCOPY W/ URETEROSCOPY  2012  . CYSTOSCOPY/RETROGRADE/URETEROSCOPY Left 03/22/2012   Procedure: CYSTOSCOPY/RETROGRADE/URETEROSCOPY AND EXTRACTION OF LEFT STONE WITH BASKET AND LEFT URETERAL STENT PLACEMENT;  Surgeon: Valetta Fuller, MD;  Location: [facility];  Service: Urology;  Laterality: Left;  1 hour req for  this case  [facility]  [facility]  [phone] HOME [phone] CELL  [employer]    . HOLMIUM LASER APPLICATION Left 03/22/2012   Procedure: HOLMIUM LASER APPLICATION;  Surgeon: Valetta Fuller, MD;  Location: [facility];  Service: Urology;  Laterality: Left;  . LITHOTRIPSY    . PELVIC LAPAROSCOPY     DL lysis of adhesions  . TONSILLECTOMY      Current Medications: Current Meds  Medication Sig  . busPIRone (BUSPAR) 10 MG tablet Take 10 mg by mouth daily.  Marland Kitchen dicyclomine (BENTYL) 10 MG capsule Take 10 mg by mouth in the morning and at bedtime.  .  fluconazole (DIFLUCAN) 150 MG tablet Take 150 mg by mouth once a week.  . fluticasone (FLONASE) 50 MCG/ACT nasal spray Place 2 sprays into both nostrils as needed.  . gabapentin (NEURONTIN) 100 MG capsule Take 100 mg by mouth 3 (three) times daily.  Marland Kitchen ibuprofen (ADVIL,MOTRIN) 200 MG tablet Take 400 mg by mouth every 8 (eight) hours as needed for moderate pain.   Marland Kitchen losartan (COZAAR) 100 MG tablet Take 100 mg by mouth daily.  Marland Kitchen losartan-hydrochlorothiazide (HYZAAR) 100-12.5 MG tablet Take 1 tablet by mouth daily.  . metFORMIN (GLUCOPHAGE) 500 MG tablet Take 1,000 mg by mouth 2 (two) times daily.  . montelukast (SINGULAIR) 10 MG tablet Take 10 mg by mouth at bedtime.  . naproxen sodium (ANAPROX) 220 MG tablet Take 440 mg by mouth 2 (two) times daily as needed (pain).  . nitroGLYCERIN (NITROSTAT) 0.4 MG SL tablet Place under the tongue as needed.  . rosuvastatin (CRESTOR) 10 MG tablet Take 10 mg by mouth daily.  Marland Kitchen venlafaxine XR (EFFEXOR-XR) 150 MG 24 hr capsule Take 150 mg by mouth in the morning and at bedtime.   . Vitamin D, Ergocalciferol, (DRISDOL) 1.25 MG (50000 UNIT) CAPS capsule Take 50,000 Units by mouth once a week.     Allergies:   Patient has no known allergies.   Social History   Socioeconomic History  . Marital status: Married    Spouse name: Not on file  . Number of children: Not on file  . Years of education: Not on file  . Highest education level: Not on file  Occupational History  . Not on file  Tobacco Use  . Smoking status: Never Smoker  . Smokeless tobacco: Never Used  Vaping Use  . Vaping Use: Never used  Substance and Sexual Activity  . Alcohol use: No  . Drug use: No  . Sexual activity: Yes    Birth control/protection: Post-menopausal    Comment: 1st intercourse 59 yo-Fewer than 5 partners  Other Topics Concern  . Not on file  Social History Narrative  . Not on file   Social Determinants of Health   Financial Resource Strain:   . Difficulty of Paying  Living Expenses:   Food Insecurity:   . Worried About Programme researcher, broadcasting/film/video in the Last Year:   . Barista in the Last Year:   Transportation Needs:   . Freight forwarder (Medical):   Marland Kitchen Lack of Transportation (Non-Medical):   Physical Activity:   . Days of Exercise per Week:   . Minutes of Exercise per Session:   Stress:   . Feeling of Stress :   Social Connections:   . Frequency of Communication with Friends and Family:   . Frequency of Social Gatherings with Friends and Family:   . Attends Religious Services:   . Active  Member of Clubs or Organizations:   . Attends Banker Meetings:   Marland Kitchen Marital Status:      Family History: The patient's family history includes Breast cancer (age of onset: 17) in her brother; Cancer in her daughter and father; Diabetes in her sister; Hypertension in her sister.  ROS:   Please see the history of present illness.    All other systems reviewed and are negative.  EKGs/Labs/Other Studies Reviewed:    The following studies were reviewed today: EKG was sinus rhythm and nonspecific ST-T changes   Recent Labs: No results found for requested labs within last 8760 hours.  Recent Lipid Panel    Component Value Date/Time   CHOL 188 07/28/2010 1742   TRIG 178 (H) 07/28/2010 1742   HDL 41 07/28/2010 1742   CHOLHDL 4.6 07/28/2010 1742   VLDL 36 07/28/2010 1742   LDLCALC 111 (H) 07/28/2010 1742    Physical Exam:    VS:  BP (!) 158/86   Pulse 86   Ht 5\' 4"  (1.626 m)   Wt 248 lb 6.4 oz (112.7 kg)   SpO2 97%   BMI 42.64 kg/m     Wt Readings from Last 3 Encounters:  08/01/19 248 lb 6.4 oz (112.7 kg)  09/04/18 246 lb (111.6 kg)  03/22/12 256 lb (116.1 kg)     GEN: Patient is in no acute distress HEENT: Normal NECK: No JVD; No carotid bruits LYMPHATICS: No lymphadenopathy CARDIAC: S1 S2 regular, 2/6 systolic murmur at the apex. RESPIRATORY:  Clear to auscultation without rales, wheezing or rhonchi  ABDOMEN: Soft,  non-tender, non-distended MUSCULOSKELETAL:  No edema; No deformity  SKIN: Warm and dry NEUROLOGIC:  Alert and oriented x 3 PSYCHIATRIC:  Normal affect    Signed, 03/24/12, MD  08/01/2019 4:03 PM    [facility]

## 2019-08-01 NOTE — Patient Instructions (Addendum)
Medication Instructions:  No medication changes. *If you need a refill on your cardiac medications before your next appointment, please call your pharmacy*   Lab Work: None ordered If you have labs (blood work) drawn today and your tests are completely normal, you will receive your results only by: Marland Kitchen MyChart Message (if you have MyChart) OR . A paper copy in the mail If you have any lab test that is abnormal or we need to change your treatment, we will call you to review the results.   Testing/Procedures: Your physician has requested that you have an echocardiogram. Echocardiography is a painless test that uses sound waves to create images of your heart. It provides your doctor with information about the size and shape of your heart and how well your heart's chambers and valves are working. This procedure takes approximately one hour. There are no restrictions for this procedure.  Your physician has requested that you have a lexiscan myoview. For further information please visit [email or web address] Please follow instruction sheet, as given.  The test will be a 2 day test and take approximately 3 to 4 hours each day  to complete; you may bring reading material.  If someone comes with you to your appointment, they will need to remain in the main lobby due to limited space in the testing area. How to prepare for your Myocardial Perfusion Test: . Do not eat or drink 3 hours prior to your test, except you may have water. . Do not consume products containing caffeine (regular or decaffeinated) 12 hours prior to your test. (ex: coffee, chocolate, sodas, tea). . Do bring a list of your current medications with you.  If not listed below, you may take your medications as normal. . Do wear comfortable clothes (no dresses or overalls) and walking shoes, tennis shoes preferred (No heels or open toe shoes are allowed). . Do NOT wear cologne, perfume, aftershave, or lotions (deodorant is allowed). . If  these instructions are not followed, your test will have to be rescheduled.      Follow-Up: At [facility], you and your health needs are our priority.  As part of our continuing mission to provide you with exceptional heart care, we have created designated Provider Care Teams.  These Care Teams include your primary Cardiologist (physician) and Advanced Practice Providers (APPs -  Physician Assistants and Nurse Practitioners) who all work together to provide you with the care you need, when you need it.  We recommend signing up for the patient portal called "MyChart".  Sign up information is provided on this After Visit Summary.  MyChart is used to connect with patients for Virtual Visits (Telemedicine).  Patients are able to view lab/test results, encounter notes, upcoming appointments, etc.  Non-urgent messages can be sent to your provider as well.   To learn more about what you can do with MyChart, go to [email or web address].    Your next appointment:   6 week(s)  The format for your next appointment:   In Person  Provider:   Belva Crome, MD   Other Instructions  Nuclear Medicine Exam A nuclear medicine exam is a safe and painless imaging test. It helps your health care provider detect and diagnose diseases. It also provides information about the ways your organs work and how they are structured. For a nuclear medicine exam, you will be given a radioactive tracer. This substance is absorbed by your body's organs. A large scanning machine detects the tracer and creates pictures of the areas  that your health care provider wants to know more about. There are several kinds of nuclear medicine exams. They include the following:  CT scan.  MRI scan.  PET scan.  SPECT scan. Tell your health care provider about:  Any allergies you have.  All medicines you are taking, including vitamins, herbs, eye drops, creams, and over-the-counter medicines.  Any problems you or family  members have had with anesthetic medicines.  Any blood disorders you have.  Any surgeries you have had.  Any medical conditions you have.  Whether you are pregnant or may be pregnant.  Whether you are nursing. What are the risks? Generally, this is a safe procedure. However, problems may occur, such as an allergic reaction to the tracer, but this is rare. What happens before the procedure? Medicines Ask your health care provider about:  Changing or stopping your regular medicines. This is especially important if you are taking diabetes medicines or blood thinners.  Taking medicines such as aspirin and ibuprofen. These medicines can thin your blood. Do not take these medicines unless your health care provider tells you to take them.  Taking over-the-counter medicines, vitamins, herbs, and supplements. General instructions  Follow instructions from your health care provider about eating or drinking restrictions.  Do not wear jewelry.  Wear loose, comfortable clothing. You may be asked to wear a hospital gown for the procedure.  Bring previous imaging studies, such as X-rays, with you to the exam if they are available. What happens during the procedure?   An IV may be inserted into one of your veins.  You will be asked to lie on a table or sit in a chair.  You will be given the radioactive tracer. You may get: ? A pill or liquid to swallow. ? An injection. ? Medicine through your IV. ? A gas to inhale.  A large scanning machine will be used to create images of your body. After the pictures are taken, you may have to wait so your health care provider can make sure that enough images were taken. The procedure may vary among health care providers and hospitals. What happens after the procedure?  You may go home after the procedure and return to your usual activities, unless your health care provider tells you otherwise.  Drink enough water to keep your urine pale yellow.  This helps to remove the radioactive tracer from your body.  It is up to you to get the results of your procedure. Ask your health care provider, or the department that is doing the procedure, when your results will be ready.  Get help right away if you have problems breathing. Summary  A nuclear medicine exam is a safe and painless imaging test that provides information about how your organs are working. It is also used to detect and diagnose diseases of various body organs.  Follow your health care provider's instructions about eating and drinking restrictions. Ask whether you should change or stop any medicines.  During the procedure, you will be given a radioactive tracer. A large scanning machine will create images of your body.  You may go home after the procedure and return to your regular activities. Follow your health care provider's instructions.  Get help right away if you have problems breathing. This information is not intended to replace advice given to you by your health care provider. Make sure you discuss any questions you have with your health care provider. Document Revised: 11/30/2017 Document Reviewed: 11/30/2017 Elsevier Patient Education  2020 Elsevier Inc.  Echocardiogram An echocardiogram is a procedure that uses painless sound waves (ultrasound) to produce an image of the heart. Images from an echocardiogram can provide important information about:  Signs of coronary artery disease (CAD).  Aneurysm detection. An aneurysm is a weak or damaged part of an artery wall that bulges out from the normal force of blood pumping through the body.  Heart size and shape. Changes in the size or shape of the heart can be associated with certain conditions, including heart failure, aneurysm, and CAD.  Heart muscle function.  Heart valve function.  Signs of a past heart attack.  Fluid buildup around the heart.  Thickening of the heart muscle.  A tumor or infectious  growth around the heart valves. Tell a health care provider about:  Any allergies you have.  All medicines you are taking, including vitamins, herbs, eye drops, creams, and over-the-counter medicines.  Any blood disorders you have.  Any surgeries you have had.  Any medical conditions you have.  Whether you are pregnant or may be pregnant. What are the risks? Generally, this is a safe procedure. However, problems may occur, including:  Allergic reaction to dye (contrast) that may be used during the procedure. What happens before the procedure? No specific preparation is needed. You may eat and drink normally. What happens during the procedure?   An IV tube may be inserted into one of your veins.  You may receive contrast through this tube. A contrast is an injection that improves the quality of the pictures from your heart.  A gel will be applied to your chest.  A wand-like tool (transducer) will be moved over your chest. The gel will help to transmit the sound waves from the transducer.  The sound waves will harmlessly bounce off of your heart to allow the heart images to be captured in real-time motion. The images will be recorded on a computer. The procedure may vary among health care providers and hospitals. What happens after the procedure?  You may return to your normal, everyday life, including diet, activities, and medicines, unless your health care provider tells you not to do that. Summary  An echocardiogram is a procedure that uses painless sound waves (ultrasound) to produce an image of the heart.  Images from an echocardiogram can provide important information about the size and shape of your heart, heart muscle function, heart valve function, and fluid buildup around your heart.  You do not need to do anything to prepare before this procedure. You may eat and drink normally.  After the echocardiogram is completed, you may return to your normal, everyday life,  unless your health care provider tells you not to do that. This information is not intended to replace advice given to you by your health care provider. Make sure you discuss any questions you have with your health care provider. Document Revised: 05/04/2018 Document Reviewed: 02/14/2016 Elsevier Patient Education  2020 [employer].

## 2019-08-03 ENCOUNTER — Ambulatory Visit (INDEPENDENT_AMBULATORY_CARE_PROVIDER_SITE_OTHER): Payer: 59

## 2019-08-03 ENCOUNTER — Telehealth: Payer: Self-pay

## 2019-08-03 DIAGNOSIS — R002 Palpitations: Secondary | ICD-10-CM | POA: Diagnosis not present

## 2019-08-03 NOTE — Telephone Encounter (Signed)
Pt will come by [place] office today for monitor to be placed.

## 2019-08-09 ENCOUNTER — Other Ambulatory Visit: Payer: Self-pay

## 2019-08-09 ENCOUNTER — Ambulatory Visit
Admission: RE | Admit: 2019-08-09 | Discharge: 2019-08-09 | Disposition: A | Discharge status: Home / Self Care | Payer: 59 | Source: Ambulatory Visit | Attending: Internal Medicine | Admitting: Internal Medicine

## 2019-08-14 ENCOUNTER — Telehealth: Payer: Self-pay | Admitting: *Deleted

## 2019-08-14 ENCOUNTER — Encounter: Payer: Self-pay | Admitting: *Deleted

## 2019-08-14 NOTE — Telephone Encounter (Signed)
Left message on voicemail per DPR in reference to upcoming appointment scheduled on 08/21/2019 at 1115 with detailed instructions given per Myocardial Perfusion Study Information Sheet for the test. LM to arrive 15 minutes early, and that it is imperative to arrive on time for appointment to keep from having the test rescheduled. If you need to cancel or reschedule your appointment, please call the office within 24 hours of your appointment. Failure to do so may result in a cancellation of your appointment, and a $50 no show fee. Phone number given for call back for any questions.  mychart letter sent with instructions.Shantara Goosby, Adelene Idler

## 2019-08-17 ENCOUNTER — Ambulatory Visit (INDEPENDENT_AMBULATORY_CARE_PROVIDER_SITE_OTHER): Payer: 59

## 2019-08-17 ENCOUNTER — Other Ambulatory Visit: Payer: Self-pay

## 2019-08-17 DIAGNOSIS — R002 Palpitations: Secondary | ICD-10-CM | POA: Diagnosis not present

## 2019-08-17 DIAGNOSIS — R011 Cardiac murmur, unspecified: Secondary | ICD-10-CM

## 2019-08-17 LAB — ECHOCARDIOGRAM COMPLETE
Area-P 1/2: 3.77 cm2
S' Lateral: 2.5 cm

## 2019-08-17 NOTE — Progress Notes (Signed)
Complete echocardiogram performed.  Jimmy Fredna Stricker RDCS, RVT  

## 2019-08-20 ENCOUNTER — Telehealth: Payer: Self-pay

## 2019-08-20 NOTE — Telephone Encounter (Signed)
-----   Message from Rajan R Revankar, MD sent at 08/20/2019  8:24 AM EDT ----- The results of the study is unremarkable. Please inform patient. I will discuss in detail at next appointment. Cc  primary care/referring physician Rajan R Revankar, MD 08/20/2019 8:24 AM  

## 2019-08-20 NOTE — Telephone Encounter (Signed)
Spoke with patient regarding results and recommendation.  Patient verbalizes understanding and is agreeable to plan of care. Advised patient to call back with any issues or concerns.  

## 2019-08-21 ENCOUNTER — Ambulatory Visit (INDEPENDENT_AMBULATORY_CARE_PROVIDER_SITE_OTHER): Payer: 59

## 2019-08-21 ENCOUNTER — Other Ambulatory Visit: Payer: Self-pay

## 2019-08-21 VITALS — Ht 64.0 in | Wt 248.0 lb

## 2019-08-21 DIAGNOSIS — I10 Essential (primary) hypertension: Secondary | ICD-10-CM | POA: Diagnosis not present

## 2019-08-21 DIAGNOSIS — R079 Chest pain, unspecified: Secondary | ICD-10-CM

## 2019-08-21 DIAGNOSIS — R072 Precordial pain: Secondary | ICD-10-CM

## 2019-08-21 DIAGNOSIS — E088 Diabetes mellitus due to underlying condition with unspecified complications: Secondary | ICD-10-CM | POA: Diagnosis not present

## 2019-08-21 DIAGNOSIS — R002 Palpitations: Secondary | ICD-10-CM | POA: Diagnosis not present

## 2019-08-21 MED ORDER — REGADENOSON 0.4 MG/5ML IV SOLN
0.4000 mg | Freq: Once | INTRAVENOUS | Status: AC
  Administered 2019-08-21: 0.4 mg via INTRAVENOUS

## 2019-08-21 MED ORDER — TECHNETIUM TC 99M TETROFOSMIN IV KIT
29.6000 | PACK | Freq: Once | INTRAVENOUS | Status: AC | PRN
  Administered 2019-08-21: 29.6 via INTRAVENOUS

## 2019-08-22 ENCOUNTER — Ambulatory Visit: Payer: 59

## 2019-08-22 ENCOUNTER — Telehealth: Payer: Self-pay

## 2019-08-22 LAB — MYOCARDIAL PERFUSION IMAGING
LV dias vol: 63 mL (ref 46–106)
LV sys vol: 22 mL
Peak HR: 107 {beats}/min
Rest HR: 79 {beats}/min
SDS: 2
SRS: 10
SSS: 12
TID: 1.1

## 2019-08-22 MED ORDER — TECHNETIUM TC 99M TETROFOSMIN IV KIT
31.6000 | PACK | Freq: Once | INTRAVENOUS | Status: AC | PRN
  Administered 2019-08-22: 31.6 via INTRAVENOUS

## 2019-08-22 NOTE — Telephone Encounter (Signed)
Left message on patients voicemail to please return our call.   

## 2019-08-22 NOTE — Telephone Encounter (Signed)
-----   Message from Garwin Brothers, MD sent at 08/22/2019  4:12 PM EDT ----- The results of the study is unremarkable. Please inform patient. I will discuss in detail at next appointment. Cc  primary care/referring physician Garwin Brothers, MD 08/22/2019 4:12 PM

## 2019-08-23 NOTE — Telephone Encounter (Signed)
Patient returning phone call about results 

## 2019-08-24 NOTE — Telephone Encounter (Signed)
Spoke with patient regarding results and recommendation.  Patient verbalizes understanding and is agreeable to plan of care. Advised patient to call back with any issues or concerns.  

## 2019-08-24 NOTE — Telephone Encounter (Signed)
Patient is calling to follow up regarding results. Please return call to discuss.

## 2019-09-17 DIAGNOSIS — M8580 Other specified disorders of bone density and structure, unspecified site: Secondary | ICD-10-CM

## 2019-09-17 DIAGNOSIS — N200 Calculus of kidney: Secondary | ICD-10-CM | POA: Insufficient documentation

## 2019-09-17 DIAGNOSIS — Z780 Asymptomatic menopausal state: Secondary | ICD-10-CM | POA: Insufficient documentation

## 2019-09-17 DIAGNOSIS — M1990 Unspecified osteoarthritis, unspecified site: Secondary | ICD-10-CM | POA: Insufficient documentation

## 2019-09-17 DIAGNOSIS — E139 Other specified diabetes mellitus without complications: Secondary | ICD-10-CM | POA: Insufficient documentation

## 2019-09-17 HISTORY — DX: Other specified diabetes mellitus without complications: E13.9

## 2019-09-17 HISTORY — DX: Unspecified osteoarthritis, unspecified site: M19.90

## 2019-09-17 HISTORY — DX: Other specified disorders of bone density and structure, unspecified site: M85.80

## 2019-09-17 HISTORY — DX: Calculus of kidney: N20.0

## 2019-09-17 HISTORY — DX: Asymptomatic menopausal state: Z78.0

## 2019-09-18 ENCOUNTER — Other Ambulatory Visit: Payer: Self-pay

## 2019-09-18 ENCOUNTER — Ambulatory Visit: Payer: 59 | Admitting: Cardiology

## 2019-09-18 ENCOUNTER — Encounter: Payer: Self-pay | Admitting: Cardiology

## 2019-09-18 VITALS — BP 136/76 | HR 84 | Ht 64.0 in | Wt 244.0 lb

## 2019-09-18 DIAGNOSIS — E782 Mixed hyperlipidemia: Secondary | ICD-10-CM | POA: Diagnosis not present

## 2019-09-18 DIAGNOSIS — R011 Cardiac murmur, unspecified: Secondary | ICD-10-CM

## 2019-09-18 DIAGNOSIS — E088 Diabetes mellitus due to underlying condition with unspecified complications: Secondary | ICD-10-CM | POA: Diagnosis not present

## 2019-09-18 NOTE — Progress Notes (Signed)
Cardiology Office Note:    Date:  09/18/2019   ID:  Heidi Hogan, DOB 05-01-1960, MRN [id number]  PCP:  Lucianne Lei, MD  Cardiologist:  Garwin Brothers, MD   Referring MD: Lucianne Lei, MD    ASSESSMENT:    1. Elevated triglycerides with high cholesterol   2. Cardiac murmur   3. Diabetes mellitus due to underlying condition with unspecified complications (HCC)   4. Mixed dyslipidemia   5. Morbid obesity (HCC)    PLAN:    In order of problems listed above:  1. Primary prevention stressed with the patient.  Importance of compliance with diet medication stressed and she vocalized understanding. 2. Essential hypertension: Blood pressure stable 3. Mixed dyslipidemia diabetes mellitus: I reviewed lab work.  Patient is on statin therapy.  She is trying to do her best to exercise on a regular basis. 4. Morbid obesity: Diet was emphasized.  Risks of obesity explained and she vocalized understanding 5. Results of the Holter monitoring, stress test and echocardiogram are detailed below.  They were discussed with the patient at length I reassured her. 6. Sleep apnea: Sleep health issues were discussed.  She is seeing a specialist for this.  Compliance urged and she promises to do so. 7. Patient will be seen in follow-up appointment in 6 months or earlier if the patient has any concerns    Medication Adjustments/Labs and Tests Ordered: Current medicines are reviewed at length with the patient today.  Concerns regarding medicines are outlined above.  No orders of the defined types were placed in this encounter.  No orders of the defined types were placed in this encounter.    No chief complaint on file.    History of Present Illness:    Heidi Hogan is a 59 y.o. female.  Patient has past medical history of essential hypertension dyslipidemia diabetes mellitus and sleep apnea.  She denies any problems at this time and takes care of activities of daily living.  She walks on a  regular basis.  No chest pain orthopnea or PND.  At the time of my evaluation, the patient is alert awake oriented and in no distress.  Past Medical History:  Diagnosis Date  . Anxious depression   . Arthritis   . Diabetes mellitus without complication (HCC)   . Hypercholesteremia   . Hypertension   . Kidney stones   . Obesity   . Osteopenia 2020   T score -1.9 FRAX 13% / 1.5%    Past Surgical History:  Procedure Laterality Date  . APPENDECTOMY    . CESAREAN SECTION    . CHOLECYSTECTOMY    . CYSTOSCOPY W/ URETEROSCOPY  2012  . CYSTOSCOPY/RETROGRADE/URETEROSCOPY Left 03/22/2012   Procedure: CYSTOSCOPY/RETROGRADE/URETEROSCOPY AND EXTRACTION OF LEFT STONE WITH BASKET AND LEFT URETERAL STENT PLACEMENT;  Surgeon: Valetta Fuller, MD;  Location: [facility];  Service: Urology;  Laterality: Left;  1 hour req for this case  [facility] Nip  C-ARM  [phone] HOME [phone] CELL  [employer]    . HOLMIUM LASER APPLICATION Left 03/22/2012   Procedure: HOLMIUM LASER APPLICATION;  Surgeon: Valetta Fuller, MD;  Location[facility];  Service: Urology;  Laterality: Left;  . LITHOTRIPSY    . PELVIC LAPAROSCOPY     DL lysis of adhesions  . TONSILLECTOMY      Current Medications: Current Meds  Medication Sig  . busPIRone (BUSPAR) 10 MG tablet Take 10 mg by mouth daily.  Marland Kitchen dicyclomine (BENTYL) 10  MG capsule Take 10 mg by mouth in the morning and at bedtime.  . gabapentin (NEURONTIN) 100 MG capsule Take 100 mg by mouth 3 (three) times daily.  Marland Kitchen ibuprofen (ADVIL,MOTRIN) 200 MG tablet Take 400 mg by mouth every 8 (eight) hours as needed for moderate pain.   Marland Kitchen JARDIANCE 10 MG TABS tablet Take 10 mg by mouth daily.  Marland Kitchen losartan-hydrochlorothiazide (HYZAAR) 100-12.5 MG tablet Take 1 tablet by mouth daily.  . metFORMIN (GLUCOPHAGE) 500 MG tablet Take 1,000 mg by mouth 2 (two) times daily.  . montelukast (SINGULAIR) 10 MG tablet Take 10 mg by mouth at bedtime.  . naproxen  sodium (ANAPROX) 220 MG tablet Take 440 mg by mouth 2 (two) times daily as needed (pain).  . nitroGLYCERIN (NITROSTAT) 0.4 MG SL tablet Place under the tongue as needed.  . rosuvastatin (CRESTOR) 10 MG tablet Take 10 mg by mouth daily.  Marland Kitchen venlafaxine XR (EFFEXOR-XR) 150 MG 24 hr capsule Take 150 mg by mouth in the morning and at bedtime.   . Vitamin D, Ergocalciferol, (DRISDOL) 1.25 MG (50000 UNIT) CAPS capsule Take 50,000 Units by mouth once a week.     Allergies:   Patient has no known allergies.   Social History   Socioeconomic History  . Marital status: Married    Spouse name: Not on file  . Number of children: Not on file  . Years of education: Not on file  . Highest education level: Not on file  Occupational History  . Not on file  Tobacco Use  . Smoking status: Never Smoker  . Smokeless tobacco: Never Used  Vaping Use  . Vaping Use: Never used  Substance and Sexual Activity  . Alcohol use: No  . Drug use: No  . Sexual activity: Yes    Birth control/protection: Post-menopausal    Comment: 1st intercourse 59 yo-Fewer than 5 partners  Other Topics Concern  . Not on file  Social History Narrative  . Not on file   Social Determinants of Health   Financial Resource Strain:   . Difficulty of Paying Living Expenses: Not on file  Food Insecurity:   . Worried About Programme researcher, broadcasting/film/video in the Last Year: Not on file  . Ran Out of Food in the Last Year: Not on file  Transportation Needs:   . Lack of Transportation (Medical): Not on file  . Lack of Transportation (Non-Medical): Not on file  Physical Activity:   . Days of Exercise per Week: Not on file  . Minutes of Exercise per Session: Not on file  Stress:   . Feeling of Stress : Not on file  Social Connections:   . Frequency of Communication with Friends and Family: Not on file  . Frequency of Social Gatherings with Friends and Family: Not on file  . Attends Religious Services: Not on file  . Active Member of Clubs or  Organizations: Not on file  . Attends Banker Meetings: Not on file  . Marital Status: Not on file     Family History: The patient's family history includes Breast cancer (age of onset: 37) in her brother; Cancer in her daughter and father; Diabetes in her sister; Hypertension in her sister.  ROS:   Please see the history of present illness.    All other systems reviewed and are negative.  EKGs/Labs/Other Studies Reviewed:    The following studies were reviewed today: Study Highlights   There was no ST segment deviation noted during stress.  No T wave inversion was noted during stress.  Defect 1: There is a small fixed defect of moderate severity present in the mid anteroseptal location, there is normal wall motion.  The left ventricular ejection fraction is normal (55-65%).  The study is normal, the fixed defect as stated above is likely soft tissue/breast attenuation.  This is a low risk study.  EVENT MONITOR REPORT:   Patient was monitored from 08/03/2019 to 08/15/2019. Indication:                    Palpitations Ordering physician:  Garwin Brothers, MD  Referring physician:        Garwin Brothers, MD    Baseline rhythm: Sinus  Minimum heart rate: 55 BPM.  Average heart rate: 89 BPM.  Maximal heart rate 168 BPM.  Atrial arrhythmia: Rare PACs and atrial runs  Ventricular arrhythmia: Occasional PVCs  Conduction abnormality: None significant  Symptoms: None significant   Conclusion:  Mildly abnormal but otherwise unremarkable event monitor.  Interpreting  cardiologist: Garwin Brothers, MD  Date: 08/30/2019 4:20 PM  IMPRESSIONS    1. Left ventricular ejection fraction, by estimation, is 60 to 65%. The  left ventricle has normal function. The left ventricle has no regional  wall motion abnormalities. Left ventricular diastolic parameters are  consistent with Grade I diastolic  dysfunction (impaired relaxation).  2. Right  ventricular systolic function is normal. The right ventricular  size is normal. There is normal pulmonary artery systolic pressure.  3. The mitral valve is normal in structure. No evidence of mitral valve  regurgitation. No evidence of mitral stenosis.  4. The aortic valve is normal in structure. Aortic valve regurgitation is  not visualized. No aortic stenosis is present.  5. The inferior vena cava is normal in size with greater than 50%  respiratory variability, suggesting right atrial pressure of 3 mmHg.        Recent Labs: No results found for requested labs within last 8760 hours.  Recent Lipid Panel    Component Value Date/Time   CHOL 188 07/28/2010 1742   TRIG 178 (H) 07/28/2010 1742   HDL 41 07/28/2010 1742   CHOLHDL 4.6 07/28/2010 1742   VLDL 36 07/28/2010 1742   LDLCALC 111 (H) 07/28/2010 1742    Physical Exam:    VS:  BP 136/76   Pulse 84   Ht 5\' 4"  (1.626 m)   Wt 244 lb (110.7 kg)   SpO2 96%   BMI 41.88 kg/m     Wt Readings from Last 3 Encounters:  09/18/19 244 lb (110.7 kg)  08/21/19 (!) 248 lb (112.5 kg)  08/01/19 248 lb 6.4 oz (112.7 kg)     GEN: Patient is in no acute distress HEENT: Normal NECK: No JVD; No carotid bruits LYMPHATICS: No lymphadenopathy CARDIAC: Hear sounds regular, 2/6 systolic murmur at the apex. RESPIRATORY:  Clear to auscultation without rales, wheezing or rhonchi  ABDOMEN: Soft, non-tender, non-distended MUSCULOSKELETAL:  No edema; No deformity  SKIN: Warm and dry NEUROLOGIC:  Alert and oriented x 3 PSYCHIATRIC:  Normal affect   Signed, 10/02/19, MD  09/18/2019 8:22 AM    [facility]

## 2019-09-18 NOTE — Patient Instructions (Signed)

## 2020-03-17 DIAGNOSIS — E119 Type 2 diabetes mellitus without complications: Secondary | ICD-10-CM | POA: Insufficient documentation

## 2020-03-17 DIAGNOSIS — N200 Calculus of kidney: Secondary | ICD-10-CM | POA: Insufficient documentation

## 2020-03-17 DIAGNOSIS — E7800 Pure hypercholesterolemia, unspecified: Secondary | ICD-10-CM | POA: Insufficient documentation

## 2020-03-17 DIAGNOSIS — E669 Obesity, unspecified: Secondary | ICD-10-CM | POA: Insufficient documentation

## 2020-03-17 DIAGNOSIS — M1990 Unspecified osteoarthritis, unspecified site: Secondary | ICD-10-CM | POA: Insufficient documentation

## 2020-03-17 DIAGNOSIS — F418 Other specified anxiety disorders: Secondary | ICD-10-CM | POA: Insufficient documentation

## 2020-03-17 DIAGNOSIS — I10 Essential (primary) hypertension: Secondary | ICD-10-CM | POA: Insufficient documentation

## 2020-03-18 ENCOUNTER — Ambulatory Visit: Payer: 59 | Admitting: Cardiology

## 2021-04-08 ENCOUNTER — Other Ambulatory Visit: Payer: Self-pay | Admitting: Internal Medicine

## 2021-04-08 DIAGNOSIS — E2839 Other primary ovarian failure: Secondary | ICD-10-CM

## 2021-04-08 DIAGNOSIS — Z1231 Encounter for screening mammogram for malignant neoplasm of breast: Secondary | ICD-10-CM

## 2021-04-14 ENCOUNTER — Ambulatory Visit: Payer: 59

## 2021-04-21 ENCOUNTER — Ambulatory Visit
Admission: RE | Admit: 2021-04-21 | Discharge: 2021-04-21 | Disposition: A | Discharge status: Home / Self Care | Payer: 59 | Source: Ambulatory Visit | Attending: Internal Medicine | Admitting: Internal Medicine

## 2021-04-21 DIAGNOSIS — Z1231 Encounter for screening mammogram for malignant neoplasm of breast: Secondary | ICD-10-CM

## 2021-04-27 ENCOUNTER — Encounter: Payer: Self-pay | Admitting: Podiatry

## 2021-04-27 ENCOUNTER — Ambulatory Visit: Payer: 59 | Admitting: Podiatry

## 2021-04-27 ENCOUNTER — Ambulatory Visit (INDEPENDENT_AMBULATORY_CARE_PROVIDER_SITE_OTHER): Payer: 59

## 2021-04-27 DIAGNOSIS — S99912A Unspecified injury of left ankle, initial encounter: Secondary | ICD-10-CM | POA: Diagnosis not present

## 2021-04-27 DIAGNOSIS — E1149 Type 2 diabetes mellitus with other diabetic neurological complication: Secondary | ICD-10-CM

## 2021-04-27 DIAGNOSIS — E1140 Type 2 diabetes mellitus with diabetic neuropathy, unspecified: Secondary | ICD-10-CM | POA: Diagnosis not present

## 2021-04-27 DIAGNOSIS — M722 Plantar fascial fibromatosis: Secondary | ICD-10-CM

## 2021-04-27 NOTE — Progress Notes (Signed)
Subjective:  ? ?Patient ID: Heidi Hogan, female   DOB: 61 y.o.   MRN: [id number]  ? ?HPI ?Patient presents stating that she traumatized her left leg and ankle approximately a month ago it has been sore and make it hard to walk with at times.  States it is improving but still present and also on the feet has had history of fasciitis-like symptoms.  Patient does not smoke likes to be active ? ? ?Review of Systems  ?All other systems reviewed and are negative. ? ? ?   ?Objective:  ?Physical Exam ?Vitals and nursing note reviewed.  ?Constitutional:   ?   Appearance: She is well-developed.  ?Pulmonary:  ?   Effort: Pulmonary effort is normal.  ?Musculoskeletal:     ?   General: Normal range of motion.  ?Skin: ?   General: Skin is warm.  ?Neurological:  ?   Mental Status: She is alert.  ?  ?Neurovascular status found to be intact muscle strength is found to be adequate range of motion adequate discomfort in the dorsum of the left lower leg with mild edema no erythema no other pathology noted with good ankle motion no crepitus of the joint no indications of pathology.  Good digital perfusion well oriented history of fasciitis ? ?   ?Assessment:  ?Traumatized left lower leg with probable deep bone bruise along with inflammatory condition and fasciitis ? ?   ?Plan:  ?H&P reviewed both conditions recommended aggressive ice therapy and utilization of good support inserts and that symptoms should get better over the next 8 weeks but if not we will consider CT scan or MRI.  Reviewed x-rays ? ?X-rays at this time are negative for fracture of the tibia lower fibula with slight change in the distal fibula but I did not note swelling pathology around this area.  Patient has history of fasciitis not present currently ?   ? ? ?

## 2021-09-22 ENCOUNTER — Ambulatory Visit
Admission: RE | Admit: 2021-09-22 | Discharge: 2021-09-22 | Disposition: A | Discharge status: Home / Self Care | Payer: 59 | Source: Ambulatory Visit | Attending: Internal Medicine | Admitting: Internal Medicine

## 2021-09-22 DIAGNOSIS — E2839 Other primary ovarian failure: Secondary | ICD-10-CM

## 2024-01-30 ENCOUNTER — Ambulatory Visit: Admitting: Podiatry

## 2024-01-30 DIAGNOSIS — M2550 Pain in unspecified joint: Secondary | ICD-10-CM | POA: Insufficient documentation

## 2024-01-30 DIAGNOSIS — M79671 Pain in right foot: Secondary | ICD-10-CM

## 2024-01-30 DIAGNOSIS — E1142 Type 2 diabetes mellitus with diabetic polyneuropathy: Secondary | ICD-10-CM

## 2024-01-30 DIAGNOSIS — M722 Plantar fascial fibromatosis: Secondary | ICD-10-CM

## 2024-01-30 NOTE — Progress Notes (Signed)
 "   Chief Complaint  Patient presents with   Diabetes    DM foot exam with order for either shoes or insert.  A1c was 5.4 in Dec No anit coag.    Discussed the use of AI scribe software for clinical note transcription with the patient, who gave verbal consent to proceed.  History of Present Illness Heidi Hogan is a 64 year old female with type 2 diabetes mellitus and diabetic polyneuropathy who presents for evaluation of right foot pain and neuropathy symptoms.  She describes chronic neuropathy with a recent recurrence of burning sensations in her feet, most prominent in the evenings. She also experiences restless legs and intermittent ankle instability, previously evaluated by another provider. She denies significant unsteadiness or the need to monitor foot placement while ambulating.  She reports ongoing intermittent pain localized to the right toe box, particularly affecting the dorsal aspect of the toes. The pain is exacerbated at times, especially on the right side. She notes that even with new, properly fitted shoes, comfort is not sustained for extended periods.  She previously discontinued gabapentin due to memory concerns and transitioned to Lyrica two months ago, with less than expected improvement in neuropathy and restless legs symptoms. She has not trialed Requip or other medications specifically for restless legs. She recently initiated osteoporosis medication.  She expresses interest in diabetic shoes and insoles.     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   Diabetes mellitus due to underlying condition with unspecified complications (HCC) 08/01/2019   Diabetes mellitus without complication (HCC)    Diverticulitis 06/09/2018   Elevated triglycerides with high cholesterol 06/09/2018   High blood pressure  08/01/2019   Hypercholesteremia    Hypertension    Kidney stone 09/17/2019   Kidney stones    Mixed dyslipidemia 08/01/2019   Morbid obesity (HCC) 08/01/2019   Obesity    Osteoarthritis 09/17/2019   Osteopenia 2020   T score -1.9 FRAX 13% / 1.5%   Osteopenia after menopause 09/17/2019   Palpitations 08/01/2019   Ureteral calculi 03/22/2012   Past Surgical History:  Procedure Laterality Date   APPENDECTOMY     CESAREAN SECTION     CHOLECYSTECTOMY     CYSTOSCOPY W/ URETEROSCOPY  2012   CYSTOSCOPY/RETROGRADE/URETEROSCOPY Left 03/22/2012   Procedure: CYSTOSCOPY/RETROGRADE/URETEROSCOPY AND EXTRACTION OF LEFT STONE WITH BASKET AND LEFT URETERAL STENT PLACEMENT;  Surgeon: Alm GORMAN Fragmin, MD;  Location: [facility];  Service: Urology;  Laterality: Left;  1 hour req for this case  [facility]ENETTE CARLS  C-ARM  [phone] HOME [phone] CELL  [employer]     HOLMIUM LASER APPLICATION Left 03/22/2012   Procedure: HOLMIUM LASER APPLICATION;  Surgeon: Alm GORMAN Fragmin, MD;  Location: [facility];  Service: Urology;  Laterality: Left;   LITHOTRIPSY     PELVIC LAPAROSCOPY     DL lysis of adhesions   TONSILLECTOMY     Allergies[1]  Physical Exam EXTREMITIES: Good pulses bilaterally.  No significant edema appreciated. MUSCULOSKELETAL: Bilateral ankle, subtalar joint, midtarsal joint, and first metatarsal joints with normal range of motion.  No pain with range of motion.  Minimal pain on palpation to the medial and central plantar fascial bands bilateral. NEUROLOGICAL: Sensation intact except decreased in right big toe. Temperature sensation intact. Vibratory sensation diminished  at the bilateral first MPJ but within normal limits at medial malleolus bilateral.. Motor strength 5/5 in lower extremities.    Assessment/Plan of Care: 1. Type 2 diabetes mellitus with diabetic polyneuropathy, without long-term current use of insulin (HCC)   2. Pain in right foot   3. Plantar fasciitis of  right foot   4. Plantar fasciitis of left foot     FOR HOME USE ONLY DME DIABETIC SHOE Assessment & Plan Diabetic polyneuropathy Chronic diabetic polyneuropathy characterized by burning and sensory changes, including diminished vibratory and monofilament sensation in the toes. No significant unsteadiness or major functional impairment. Lyrica has provided suboptimal symptom control; gabapentin was discontinued due to memory impairment. No acute complications identified. - Performed monofilament and vibratory sensation testing to assess neuropathy severity. - Provided education on foot protection, emphasizing daily foot checks and avoidance of barefoot walking. - Discussed the importance of monitoring for loss of protective sensation and provided anticipatory guidance regarding injury risk. - Reviewed medication options for neuropathy and restless legs, including potential future trials of Cymbalta or Requip, to be coordinated with her primary care provider.  History of plantar fasciitis Plantar fasciitis with mild recurrence of symptoms, currently not significantly limiting function. - Assessed for plantar fasciitis during foot examination. - Discussed footwear and insole options to enhance support and reduce symptom recurrence.  Right foot pain/neuropathy Chronic right foot pain involving the toes and toe box, with intermittent discomfort and mild nail changes. No acute findings or significant functional limitation. - Performed detailed foot examination, including assessment of joints, range of motion, and nail evaluation. - Discussed shoe fit and options for diabetic shoes and insoles to improve comfort and alleviate pain. - Initiated referral to [facility] for diabetic shoe program, including completion of required paperwork and coordination with her family physician for necessary forms.  Follow-up annually for diabetic foot check  Shanetha Bradham DSABRA [place], DPM, FACFAS [facility]      [address] [place], [place] [address]                Office [phone]  Fax [phone]      [1] No Known Allergies  "
